# Patient Record
Sex: Male | Born: 1980
Health system: Southern US, Community
[De-identification: ages and names within clinical notes are randomized; demographics above are authoritative.]

## PROBLEM LIST (undated history)

## (undated) HISTORY — PX: OTHER SURGICAL HISTORY: SHX169

---

## 2019-07-17 ENCOUNTER — Telehealth: Payer: Self-pay

## 2019-07-17 ENCOUNTER — Encounter: Payer: Self-pay | Admitting: Family Medicine

## 2019-07-17 ENCOUNTER — Other Ambulatory Visit: Payer: Self-pay

## 2019-07-17 ENCOUNTER — Ambulatory Visit (INDEPENDENT_AMBULATORY_CARE_PROVIDER_SITE_OTHER): Payer: 59 | Admitting: Family Medicine

## 2019-07-17 VITALS — BP 150/84 | HR 94 | Temp 97.8°F | Ht 70.0 in | Wt 309.2 lb

## 2019-07-17 DIAGNOSIS — M25461 Effusion, right knee: Secondary | ICD-10-CM

## 2019-07-17 DIAGNOSIS — M25561 Pain in right knee: Secondary | ICD-10-CM | POA: Diagnosis not present

## 2019-07-17 MED ORDER — METHYLPREDNISOLONE ACETATE 40 MG/ML IJ SUSP
80.0000 mg | Freq: Once | INTRAMUSCULAR | Status: AC
  Administered 2019-07-17: 80 mg via INTRA_ARTICULAR

## 2019-07-17 NOTE — Patient Instructions (Signed)
Set-up a primary care new patient appointment with Dr. Damita Dunnings

## 2019-07-17 NOTE — Telephone Encounter (Signed)
The [place] message did not come thru [facility] portal but [facility] faxed a note for pt that needs knee drained due to swelling. Per Morey Hummingbird at front desk note Dr Lorelei Pont will see pt 07/17/19 at 3:40pm for knee issue only and not as PCP. Oked by Dr Lorelei Pont per Morey Hummingbird. copy of [place] fax sent for scanning.

## 2019-07-17 NOTE — Progress Notes (Signed)
Daleon Willinger T. Carle Fenech, MD Primary Care and [place] at [facility] [place], [address] Phone: [phone]  FAX: [phone]  Rodney Ford - 38 y.o. male  MRN [id number]  Date of Birth: 05/17/1981  Visit Date: 07/17/2019  PCP: Patient, No Pcp Per  Referred by: No ref. provider found  Chief Complaint  Patient presents with  . Knee Pain    RIght   Subjective:   Rodney Ford is a 38 y.o. very pleasant male patient with Body mass index is 44.37 kg/m. who presents with the following:  Rodney Ford is a very nice 38 year old gentleman with weight of 309 pounds and BMI of 44 who presents as a new patient to our office.  He previously had been living in [place], but he is from [place].  He presents today with some acute knee effusion and significant pain.  He does have a prior history of arthroscopy of the right knee done in [place].  He has had his knee drained another time in the past which brought him some significant relief.  He is not having any symptomatic giving way, but his motion is significantly impaired secondary to the large effusion.  Had some sciatica after he moved about three weeks ago.  Moved and then got better, no back pain but he has some sciatica.  Playedd somegolf on Sunday.  Tuesday his knee got a little big and swollen up this morning. Last year had ssame thing.   Scope on 2011, [place].   Past Medical History, Surgical History, Social History, Family History, Problem List, Medications, and Allergies have been reviewed and updated if relevant.  There are no active problems to display for this patient.   History reviewed. No pertinent past medical history.  Past Surgical History:  Procedure Laterality Date  . orthoscopic knee surgery right knee      Social History   Socioeconomic History  . Marital status: Divorced    Spouse name: Not on file  . Number of children: Not on file  . Years of  education: Not on file  . Highest education level: Not on file  Occupational History  . Not on file  Social Needs  . Financial resource strain: Not on file  . Food insecurity    Worry: Not on file    Inability: Not on file  . Transportation needs    Medical: Not on file    Non-medical: Not on file  Tobacco Use  . Smoking status: Current Some Day Smoker    Types: Cigars  . Smokeless tobacco: Never Used  Substance and Sexual Activity  . Alcohol use: Not on file  . Drug use: Not on file  . Sexual activity: Not on file  Lifestyle  . Physical activity    Days per week: Not on file    Minutes per session: Not on file  . Stress: Not on file  Relationships  . Social Herbalist on phone: Not on file    Gets together: Not on file    Attends religious service: Not on file    Active member of club or organization: Not on file    Attends meetings of clubs or organizations: Not on file    Relationship status: Not on file  . Intimate partner violence    Fear of current or ex partner: Not on file    Emotionally abused: Not on file    Physically abused: Not on file  Forced sexual activity: Not on file  Other Topics Concern  . Not on file  Social History Narrative  . Not on file    Family History  Problem Relation Age of Onset  . Anemia Mother   . Diabetes Brother   . Lung cancer Paternal Grandfather     Allergies  Allergen Reactions  . Bee Venom     Medication list reviewed and updated in full in [facility].  GEN: No fevers, chills. Nontoxic. Primarily MSK c/o today. MSK: Detailed in the HPI GI: tolerating PO intake without difficulty Neuro: No numbness, parasthesias, or tingling associated. Otherwise the pertinent positives of the ROS are noted above.   Objective:   BP (!) 150/84   Pulse 94   Temp 97.8 F (36.6 C) (Temporal)   Ht 5\' 10"  (1.778 m)   Wt (!) 309 lb 4 oz (140.3 kg)   SpO2 97%   BMI 44.37 kg/m    GEN: WDWN, NAD, Non-toxic,  Alert & Oriented x 3 HEENT: Atraumatic, Normocephalic.  Ears and Nose: No external deformity. EXTR: No clubbing/cyanosis/edema NEURO: Normal gait.  PSYCH: Normally interactive. Conversant. Not depressed or anxious appearing.  Calm demeanor.    Right knee: Large ballotable effusion.  Mild tenderness on the medial and lateral joint lines.  ACL, PCL, MCL, and LCL are intact.  Other special tests are limited secondary to large effusion.  Radiology: No results found.  Assessment and Plan:     ICD-10-CM   1. Acute pain of right knee  M25.561 methylPREDNISolone acetate (DEPO-MEDROL) injection 80 mg  2. Knee effusion, right  M25.461    Large effusion today, for symptomatic relief I think is reasonable to aspirate his knee and inject it with some corticosteroid.  He also asked me for the name of a primary care doctor and I gave him the name of both Dr. and Dr. Para March.  Aspiration/Injection Procedure Note Rodney Ford April 02, 1981 Date of procedure: 07/17/2019  Procedure: Large Joint Aspiration / Injection with synovial fluid aspiration of knee, R Indications: Pain  Procedure Details Patient verbally consented; risks, benefits, and alternatives explained including possible infection. Patient prepped with Chloraprep. Ethyl chloride for anesthesia. 10 cc of 1% Lidocaine used in wheal then injected Subcutaneous fashion with 22 gauge needle on lateral approach. Under sterilne conditions, 18 gauge needle used via lateral approach to aspirate 85 cc of serosanguinous fluid. Then 8 cc of Lidocaine 1% and 2 mL of Depo-Medrol 40 mg injected. Tolerated well, decreased pain, no complications. Medication: 2 mL of Depo-Medrol 40 mg, equaling Depo-Medrol 80 mg total   Follow-up: prn and with new pcp  Meds ordered this encounter  Medications  . methylPREDNISolone acetate (DEPO-MEDROL) injection 80 mg   No orders of the defined types were placed in this encounter.   Signed,  07/19/2019. Ricquel Foulk,  MD   Outpatient Encounter Medications as of 07/17/2019  Medication Sig  . [EXPIRED] methylPREDNISolone acetate (DEPO-MEDROL) injection 80 mg    No facility-administered encounter medications on file as of 07/17/2019.

## 2019-08-12 ENCOUNTER — Encounter: Payer: Self-pay | Admitting: Family Medicine

## 2019-08-12 ENCOUNTER — Ambulatory Visit (INDEPENDENT_AMBULATORY_CARE_PROVIDER_SITE_OTHER): Payer: 59 | Admitting: Family Medicine

## 2019-08-12 ENCOUNTER — Other Ambulatory Visit: Payer: Self-pay

## 2019-08-12 VITALS — BP 159/95 | HR 84 | Temp 97.6°F | Ht 69.75 in | Wt 303.5 lb

## 2019-08-12 DIAGNOSIS — Z114 Encounter for screening for human immunodeficiency virus [HIV]: Secondary | ICD-10-CM

## 2019-08-12 DIAGNOSIS — F17290 Nicotine dependence, other tobacco product, uncomplicated: Secondary | ICD-10-CM | POA: Diagnosis not present

## 2019-08-12 DIAGNOSIS — M1A9XX0 Chronic gout, unspecified, without tophus (tophi): Secondary | ICD-10-CM | POA: Insufficient documentation

## 2019-08-12 DIAGNOSIS — M109 Gout, unspecified: Secondary | ICD-10-CM | POA: Insufficient documentation

## 2019-08-12 DIAGNOSIS — I10 Essential (primary) hypertension: Secondary | ICD-10-CM | POA: Insufficient documentation

## 2019-08-12 LAB — COMPREHENSIVE METABOLIC PANEL
ALT: 42 U/L (ref 0–53)
AST: 26 U/L (ref 0–37)
Albumin: 4.5 g/dL (ref 3.5–5.2)
Alkaline Phosphatase: 45 U/L (ref 39–117)
BUN: 12 mg/dL (ref 6–23)
CO2: 28 mEq/L (ref 19–32)
Calcium: 9.6 mg/dL (ref 8.4–10.5)
Chloride: 102 mEq/L (ref 96–112)
Creatinine, Ser: 1.1 mg/dL (ref 0.40–1.50)
GFR: 89.48 mL/min (ref >60.00)
Glucose, Bld: 118 mg/dL — ABNORMAL HIGH (ref 70–99)
Potassium: 4.4 mEq/L (ref 3.5–5.1)
Sodium: 137 mEq/L (ref 135–145)
Total Bilirubin: 0.3 mg/dL (ref 0.2–1.2)
Total Protein: 7.7 g/dL (ref 6.0–8.3)

## 2019-08-12 LAB — LIPID PANEL
Cholesterol: 279 mg/dL — ABNORMAL HIGH (ref 0–200)
HDL: 43.7 mg/dL (ref >39.00)
Total CHOL/HDL Ratio: 6
Triglycerides: 418 mg/dL — ABNORMAL HIGH (ref 0.0–149.0)

## 2019-08-12 LAB — HEMOGLOBIN A1C: Hgb A1c MFr Bld: 5.8 % (ref 4.6–6.5)

## 2019-08-12 LAB — LDL CHOLESTEROL, DIRECT: Direct LDL: 147 mg/dL

## 2019-08-12 NOTE — Progress Notes (Signed)
Subjective:     Rodney Ford is a 38 y.o. male presenting for Establish Care (previous PCP Dr Berdine Addison in [place] 2018)     HPI   #HTN - has been in the 150s the last few times - had to have his knee drained last month - had sciatic pain in June - no cp, sob, HA, vision changes   #Skin infection - had a boil that was treated with antibiotics and popped on its own  - much better now than before - drained a lot of fluid when it popped on its own - pain has resolved - has a few days left of abx   Review of Systems  Eyes: Negative for visual disturbance.  Respiratory: Negative for shortness of breath.   Cardiovascular: Negative for chest pain.  Neurological: Negative for headaches.     Social History   Tobacco Use  Smoking Status Current Some Day Smoker  . Years: 17.00  . Types: Cigars  Smokeless Tobacco Never Used  Tobacco Comment   3-4 days a week, 2-3 cigars        Objective:    BP Readings from Last 3 Encounters:  08/12/19 (!) 159/95  07/17/19 (!) 150/84   Wt Readings from Last 3 Encounters:  08/12/19 (!) 303 lb 8 oz (137.7 kg)  07/17/19 (!) 309 lb 4 oz (140.3 kg)    BP (!) 159/95   Pulse 84   Temp 97.6 F (36.4 C)   Ht 5' 9.75" (1.772 m)   Wt (!) 303 lb 8 oz (137.7 kg)   SpO2 97%   BMI 43.86 kg/m    Physical Exam Constitutional:      Appearance: Normal appearance. He is not ill-appearing or diaphoretic.  HENT:     Right Ear: External ear normal.     Left Ear: External ear normal.     Nose: Nose normal.  Eyes:     General: No scleral icterus.    Extraocular Movements: Extraocular movements intact.     Conjunctiva/sclera: Conjunctivae normal.  Cardiovascular:     Rate and Rhythm: Normal rate and regular rhythm.     Heart sounds: No murmur.  Pulmonary:     Effort: Pulmonary effort is normal. No respiratory distress.     Breath sounds: Normal breath sounds. No wheezing.  Musculoskeletal:     Cervical back: Neck supple.  Skin:  General: Skin is warm and dry.     Comments: Ulcer on right upper back without underlying fluid pocket. Granulation tissue present. Non-tender to palpation  Neurological:     Mental Status: He is alert. Mental status is at baseline.  Psychiatric:        Mood and Affect: Mood normal.           Assessment & Plan:   Problem List Items Addressed This Visit      Cardiovascular and Mediastinum   Essential hypertension - Primary    BP elevated today. Pt would like to avoid medication. Long discussion about lifestyle changes. Pt will try to exercise, lose weight, and eat healthy.       Relevant Orders   Comprehensive metabolic panel     Other   Morbid obesity ([facility])    Not interested in surgery. Encouraged weight loss - already down 10 lbs.       Relevant Orders   Lipid Profile   Hemoglobin A1c   Cigar smoker unmotivated to quit    Encourage cessation, pt has cut back but  is not interested in quitting. Will continue to address.        Other Visit Diagnoses    Screening for HIV (human immunodeficiency virus)       Relevant Orders   HIV antibody (with reflex)       Return in about 3 months (around 11/10/2019).  Lynnda Child, MD

## 2019-08-12 NOTE — Assessment & Plan Note (Signed)
Not interested in surgery. Encouraged weight loss - already down 10 lbs.

## 2019-08-12 NOTE — Assessment & Plan Note (Signed)
BP elevated today. Pt would like to avoid medication. Long discussion about lifestyle changes. Pt will try to exercise, lose weight, and eat healthy.

## 2019-08-12 NOTE — Patient Instructions (Signed)
Your blood pressure high.   High blood pressure increases your risk for heart attack and stroke.    Please check your blood pressure 2-4 times a week.   To check your blood pressure 1) Sit in a quiet and relaxed place for 5 minutes 2) Make sure your feet are flat on the ground 3) Consider checking first thing in the morning   Normal blood pressure is less than 140/90 Ideally you blood pressure should be around 120/80  Other ways you can reduce your blood pressure:  1) Regular exercise -- Try to get 150 minutes (30 minutes, 5 days a week) of moderate to vigorous aerobic excercise -- Examples: brisk walking (2.5 miles per hour), water aerobics, dancing, gardening, tennis, biking slower than 10 miles per hour 2) DASH Diet - low fat meats, more fresh fruits and vegetables, whole grains, low salt 3) Quit smoking if you smoke 4) Loose 5-10% of your body weight    DASH Eating Plan DASH stands for "Dietary Approaches to Stop Hypertension." The DASH eating plan is a healthy eating plan that has been shown to reduce high blood pressure (hypertension). It may also reduce your risk for type 2 diabetes, heart disease, and stroke. The DASH eating plan may also help with weight loss. What are tips for following this plan?  General guidelines  Avoid eating more than 2,300 mg (milligrams) of salt (sodium) a day. If you have hypertension, you may need to reduce your sodium intake to 1,500 mg a day.  Limit alcohol intake to no more than 1 drink a day for nonpregnant women and 2 drinks a day for men. One drink equals 12 oz of beer, 5 oz of wine, or 1 oz of hard liquor.  Work with your health care provider to maintain a healthy body weight or to lose weight. Ask what an ideal weight is for you.  Get at least 30 minutes of exercise that causes your heart to beat faster (aerobic exercise) most days of the week. Activities may include walking, swimming, or biking.  Work with your health care provider  or diet and nutrition specialist (dietitian) to adjust your eating plan to your individual calorie needs. Reading food labels   Check food labels for the amount of sodium per serving. Choose foods with less than 5 percent of the Daily Value of sodium. Generally, foods with less than 300 mg of sodium per serving fit into this eating plan.  To find whole grains, look for the word "whole" as the first word in the ingredient list. Shopping  Buy products labeled as "low-sodium" or "no salt added."  Buy fresh foods. Avoid canned foods and premade or frozen meals. Cooking  Avoid adding salt when cooking. Use salt-free seasonings or herbs instead of table salt or sea salt. Check with your health care provider or pharmacist before using salt substitutes.  Do not fry foods. Cook foods using healthy methods such as baking, boiling, grilling, and broiling instead.  Cook with heart-healthy oils, such as olive, canola, soybean, or sunflower oil. Meal planning  Eat a balanced diet that includes: ? 5 or more servings of fruits and vegetables each day. At each meal, try to fill half of your plate with fruits and vegetables. ? Up to 6-8 servings of whole grains each day. ? Less than 6 oz of lean meat, poultry, or fish each day. A 3-oz serving of meat is about the same size as a deck of cards. One egg equals 1 oz. ?   2 servings of low-fat dairy each day. ? A serving of nuts, seeds, or beans 5 times each week. ? Heart-healthy fats. Healthy fats called Omega-3 fatty acids are found in foods such as flaxseeds and coldwater fish, like sardines, salmon, and mackerel.  Limit how much you eat of the following: ? Canned or prepackaged foods. ? Food that is high in trans fat, such as fried foods. ? Food that is high in saturated fat, such as fatty meat. ? Sweets, desserts, sugary drinks, and other foods with added sugar. ? Full-fat dairy products.  Do not salt foods before eating.  Try to eat at least 2  vegetarian meals each week.  Eat more home-cooked food and less restaurant, buffet, and fast food.  When eating at a restaurant, ask that your food be prepared with less salt or no salt, if possible. What foods are recommended? The items listed may not be a complete list. Talk with your dietitian about what dietary choices are best for you. Grains Whole-grain or whole-wheat bread. Whole-grain or whole-wheat pasta. Brown rice. Oatmeal. Quinoa. Bulgur. Whole-grain and low-sodium cereals. Pita bread. Low-fat, low-sodium crackers. Whole-wheat flour tortillas. Vegetables Fresh or frozen vegetables (raw, steamed, roasted, or grilled). Low-sodium or reduced-sodium tomato and vegetable juice. Low-sodium or reduced-sodium tomato sauce and tomato paste. Low-sodium or reduced-sodium canned vegetables. Fruits All fresh, dried, or frozen fruit. Canned fruit in natural juice (without added sugar). Meat and other protein foods Skinless chicken or turkey. Ground chicken or turkey. Pork with fat trimmed off. Fish and seafood. Egg whites. Dried beans, peas, or lentils. Unsalted nuts, nut butters, and seeds. Unsalted canned beans. Lean cuts of beef with fat trimmed off. Low-sodium, lean deli meat. Dairy Low-fat (1%) or fat-free (skim) milk. Fat-free, low-fat, or reduced-fat cheeses. Nonfat, low-sodium ricotta or cottage cheese. Low-fat or nonfat yogurt. Low-fat, low-sodium cheese. Fats and oils Soft margarine without trans fats. Vegetable oil. Low-fat, reduced-fat, or light mayonnaise and salad dressings (reduced-sodium). Canola, safflower, olive, soybean, and sunflower oils. Avocado. Seasoning and other foods Herbs. Spices. Seasoning mixes without salt. Unsalted popcorn and pretzels. Fat-free sweets. What foods are not recommended? The items listed may not be a complete list. Talk with your dietitian about what dietary choices are best for you. Grains Baked goods made with fat, such as croissants, muffins, or  some breads. Dry pasta or rice meal packs. Vegetables Creamed or fried vegetables. Vegetables in a cheese sauce. Regular canned vegetables (not low-sodium or reduced-sodium). Regular canned tomato sauce and paste (not low-sodium or reduced-sodium). Regular tomato and vegetable juice (not low-sodium or reduced-sodium). Pickles. Olives. Fruits Canned fruit in a light or heavy syrup. Fried fruit. Fruit in cream or butter sauce. Meat and other protein foods Fatty cuts of meat. Ribs. Fried meat. Bacon. Sausage. Bologna and other processed lunch meats. Salami. Fatback. Hotdogs. Bratwurst. Salted nuts and seeds. Canned beans with added salt. Canned or smoked fish. Whole eggs or egg yolks. Chicken or turkey with skin. Dairy Whole or 2% milk, cream, and half-and-half. Whole or full-fat cream cheese. Whole-fat or sweetened yogurt. Full-fat cheese. Nondairy creamers. Whipped toppings. Processed cheese and cheese spreads. Fats and oils Butter. Stick margarine. Lard. Shortening. Ghee. Bacon fat. Tropical oils, such as coconut, palm kernel, or palm oil. Seasoning and other foods Salted popcorn and pretzels. Onion salt, garlic salt, seasoned salt, table salt, and sea salt. Worcestershire sauce. Tartar sauce. Barbecue sauce. Teriyaki sauce. Soy sauce, including reduced-sodium. Steak sauce. Canned and packaged gravies. Fish sauce. Oyster sauce. Cocktail sauce.   Horseradish that you find on the shelf. Ketchup. Mustard. Meat flavorings and tenderizers. Bouillon cubes. Hot sauce and Tabasco sauce. Premade or packaged marinades. Premade or packaged taco seasonings. Relishes. Regular salad dressings. Where to find more information:  National Heart, Lung, and Blood Institute: www.nhlbi.nih.gov  American Heart Association: www.heart.org Summary  The DASH eating plan is a healthy eating plan that has been shown to reduce high blood pressure (hypertension). It may also reduce your risk for type 2 diabetes, heart disease,  and stroke.  With the DASH eating plan, you should limit salt (sodium) intake to 2,300 mg a day. If you have hypertension, you may need to reduce your sodium intake to 1,500 mg a day.  When on the DASH eating plan, aim to eat more fresh fruits and vegetables, whole grains, lean proteins, low-fat dairy, and heart-healthy fats.  Work with your health care provider or diet and nutrition specialist (dietitian) to adjust your eating plan to your individual calorie needs. This information is not intended to replace advice given to you by your health care provider. Make sure you discuss any questions you have with your health care provider. Document Released: 08/04/2011 Document Revised: 07/28/2017 Document Reviewed: 08/08/2016 Elsevier Patient Education  2020 Elsevier Inc.  

## 2019-08-12 NOTE — Assessment & Plan Note (Signed)
Encourage cessation, pt has cut back but is not interested in quitting. Will continue to address.

## 2019-08-13 ENCOUNTER — Encounter: Payer: Self-pay | Admitting: Family Medicine

## 2019-08-13 LAB — HIV ANTIBODY (ROUTINE TESTING W REFLEX): HIV 1&2 Ab, 4th Generation: NONREACTIVE

## 2019-10-07 ENCOUNTER — Encounter: Payer: Self-pay | Admitting: Family Medicine

## 2019-11-28 ENCOUNTER — Ambulatory Visit: Payer: 59 | Attending: Internal Medicine

## 2019-11-28 DIAGNOSIS — Z23 Encounter for immunization: Secondary | ICD-10-CM

## 2019-11-28 NOTE — Progress Notes (Signed)
   Covid-19 Vaccination Clinic  Name:  Rodney Ford    MRN: [id number] DOB: Dec 06, 1980  11/28/2019  [name] was observed post Covid-19 immunization for 15 minutes without incident. He was provided with Vaccine Information Sheet and instruction to access the V-Safe system.   [name] was instructed to call 911 with any severe reactions post vaccine: Marland Kitchen Difficulty breathing  . Swelling of face and throat  . A fast heartbeat  . A bad rash all over body  . Dizziness and weakness   Immunizations Administered    Name Date Dose VIS Date Route   Pfizer COVID-19 Vaccine 11/28/2019 11:02 AM 0.3 mL 08/09/2019 Intramuscular   Manufacturer: [employer], [employer]   Lot: [id number]   NDC: [address]-8

## 2019-12-17 ENCOUNTER — Other Ambulatory Visit: Payer: Self-pay

## 2019-12-17 ENCOUNTER — Ambulatory Visit (INDEPENDENT_AMBULATORY_CARE_PROVIDER_SITE_OTHER)
Admission: RE | Admit: 2019-12-17 | Discharge: 2019-12-17 | Disposition: A | Discharge status: Home / Self Care | Payer: 59 | Source: Ambulatory Visit

## 2019-12-17 DIAGNOSIS — G5701 Lesion of sciatic nerve, right lower limb: Secondary | ICD-10-CM | POA: Diagnosis not present

## 2019-12-17 MED ORDER — KETOROLAC TROMETHAMINE 10 MG PO TABS: 10.0000 mg | ORAL_TABLET | Freq: Four times a day (QID) | ORAL | 0 refills | Status: DC | PRN

## 2019-12-17 NOTE — Discharge Instructions (Signed)
Take the ketorolac as directed.    Follow up with your primary care provider or come here to be seen in person if your symptoms are not improving.

## 2019-12-17 NOTE — ED Provider Notes (Signed)
Virtual Visit via Video Note:  Rodney Ford  initiated request for Telemedicine visit with [facility] Urgent [facility] team. I connected with Rodney Ford  on 12/17/2019 at 10:50 AM  for a synchronized telemedicine visit using a video enabled HIPPA compliant telemedicine application. I verified that I am speaking with Rodney Ford  using two identifiers. Rodney Bail, NP  was physically located in a [facility] Urgent care site and Rodney Ford was located at a different location.   The limitations of evaluation and management by telemedicine as well as the availability of in-person appointments were discussed. Patient was informed that he  may incur a bill ( including co-pay) for this virtual visit encounter. Rodney Ford  expressed understanding and gave verbal consent to proceed with virtual visit.     History of Present Illness:Rodney Ford  is a 39 y.o. male presents for evaluation of 3 day history of right buttock pain from his right upper buttock down to his right posterior upper thigh.  He states his pain began after overworking his muscles this weekend.  The pain is worse with bending, sitting, lying down; improves with standing.  He has tried stretching the muscles and massage without relief.  This feels like previous episodes of piriformis syndrome.  He denies numbness or weakness.  No fever, rash, lesions, or other symptoms.     Allergies  Allergen Reactions  . Bee Venom      History reviewed. No pertinent past medical history.   Social History   Tobacco Use  . Smoking status: Current Some Day Smoker    Years: 17.00    Types: Cigars  . Smokeless tobacco: Never Used  . Tobacco comment: 3-4 days a week, 2-3 cigars  Substance Use Topics  . Alcohol use: Yes    Comment: 2 times a week  . Drug use: Never   ROS: as stated in HPI.  All other systems reviewed and negative.      Observations/Objective: Physical Exam  VITALS: Patient denies fever. GENERAL: Alert, appears well  and in no acute distress. HEENT: Atraumatic. NECK: Normal movements of the head and neck. CARDIOPULMONARY: No increased WOB. Speaking in clear sentences. I:E ratio WNL.  MS: Moves all visible extremities without noticeable abnormality. PSYCH: Pleasant and cooperative, well-groomed. Speech normal rate and rhythm. Affect is appropriate. Insight and judgement are appropriate. Attention is focused, linear, and appropriate.  NEURO: CN grossly intact. Oriented as arrived to appointment on time with no prompting. Moves both UE equally.  SKIN: No obvious lesions, wounds, erythema, or cyanosis noted on face or hands.   Assessment and Plan:    ICD-10-CM   1. Piriformis syndrome of right side  G57.01        Follow Up Instructions: Treating with ketorolac as this has treated his symptoms successfully in the past.  Patient states he already has Flexeril that he will use as needed.  He will also continue stretching exercises.  Instructed him to follow up with his PCP if he symptoms do not improve.  Patient agrees with plan of care.      I discussed the assessment and treatment plan with the patient. The patient was provided an opportunity to ask questions and all were answered. The patient agreed with the plan and demonstrated an understanding of the instructions.   The patient was advised to call back or seek an in-person evaluation if the symptoms worsen or if the condition fails to improve as anticipated.  Sharion Balloon, NP  12/17/2019 10:50 AM         Sharion Balloon, NP 12/17/19 1050

## 2019-12-18 ENCOUNTER — Encounter: Payer: Self-pay | Admitting: Family Medicine

## 2019-12-18 ENCOUNTER — Ambulatory Visit (INDEPENDENT_AMBULATORY_CARE_PROVIDER_SITE_OTHER): Payer: 59 | Admitting: Family Medicine

## 2019-12-18 DIAGNOSIS — G5701 Lesion of sciatic nerve, right lower limb: Secondary | ICD-10-CM

## 2019-12-18 MED ORDER — DEXAMETHASONE SODIUM PHOSPHATE 10 MG/ML IJ SOLN
10.0000 mg | Freq: Once | INTRAMUSCULAR | Status: AC
  Administered 2019-12-18: 13:00:00 10 mg via INTRAMUSCULAR

## 2019-12-18 MED ORDER — ACETAMINOPHEN-CODEINE #3 300-30 MG PO TABS: 1.0000 | ORAL_TABLET | Freq: Three times a day (TID) | ORAL | 0 refills | Status: DC | PRN

## 2019-12-18 NOTE — Progress Notes (Signed)
This visit was conducted in person.  BP (!) 154/104 (BP Location: Right Arm, Patient Position: Sitting, Cuff Size: Large)   Pulse 85   Temp 97.9 F (36.6 C) (Temporal)   Ht 5' 9.75" (1.772 m)   Wt 299 lb 8 oz (135.9 kg)   SpO2 96%   BMI 43.28 kg/m    CC: "I think my piriformis flared back up" Subjective:    Patient ID: Rodney Ford, male    DOB: 02-Oct-1980, 39 y.o.   MRN: [id number]  HPI: Rodney Ford is a 39 y.o. male presenting on 12/18/2019 for Hip Pain (C/o constant right hip pain, worsening.  Started 12/15/19.  H/o piriformis syndrome.  Tried Toradol and cyclobenzaprine 10 mg, not helpful. )   3d h/o progressively worsening R hip pain - started after he was lifting friend's riding lawnmower, felt he pulled groin when he lay it down. No significant acute pain at that time but after 2 hour drive he noticed some tightness to R lateral thigh treated with ibuprofen without much benefit. Yesterday acutely worse - seen for telemedicine visit yesterday by [facility] - dx piriformis syndrome, treated with ketorolac 10mg  Q6 hours PRN #20 and flexeril without much benefit. Affecting ability to rest at night time.   Describes constant pain R lateral thigh that starts at lateral-posterior buttock and travels down thigh to lateral calf. Most tightness mid lateral thigh. Better when laying on left. Worse with sitting. When pain gets bad he feels outer foot numbness. No fevers.   No back pain. No weakness of legs. No bowel/bladder accidents.   BP elevated - attributes to pain. Reviewing chart, has had elevated readings in the past - he has not been checking at home  BP Readings from Last 3 Encounters:  12/18/19 (!) 154/104  08/12/19 (!) 159/95  07/17/19 (!) 150/84        Relevant past medical, surgical, family and social history reviewed and updated as indicated. Interim medical history since our last visit reviewed. Allergies and medications reviewed and updated. Outpatient Medications Prior  to Visit  Medication Sig Dispense Refill  . ketorolac (TORADOL) 10 MG tablet Take 1 tablet (10 mg total) by mouth every 6 (six) hours as needed. 20 tablet 0   No facility-administered medications prior to visit.     Per HPI unless specifically indicated in ROS section below Review of Systems Objective:    BP (!) 154/104 (BP Location: Right Arm, Patient Position: Sitting, Cuff Size: Large)   Pulse 85   Temp 97.9 F (36.6 C) (Temporal)   Ht 5' 9.75" (1.772 m)   Wt 299 lb 8 oz (135.9 kg)   SpO2 96%   BMI 43.28 kg/m   Wt Readings from Last 3 Encounters:  12/18/19 299 lb 8 oz (135.9 kg)  08/12/19 (!) 303 lb 8 oz (137.7 kg)  07/17/19 (!) 309 lb 4 oz (140.3 kg)    Physical Exam Vitals and nursing note reviewed.  Constitutional:      General: He is in acute distress.     Appearance: Normal appearance. He is obese. He is not ill-appearing.     Comments: Marked discomfort, unable to sit or lay supine due to pain  Musculoskeletal:        General: Tenderness present.     Right lower leg: No edema.     Left lower leg: No edema.     Comments:  Limited exam due to pain No midline spine tenderness No significnat paraspinous mm tenderness No  pain at SIJ, GTB bilaterally.  +++ pain at sciatic notch on right Stiff gait  Skin:    General: Skin is warm and dry.     Findings: No rash.  Neurological:     Mental Status: He is alert.     Comments: Able to plantar and dorsi flex both feet against resistance (5/5 strength)       Lab Results  Component Value Date   CREATININE 1.10 08/12/2019   BUN 12 08/12/2019   NA 137 08/12/2019   K 4.4 08/12/2019   CL 102 08/12/2019   CO2 28 08/12/2019    Lab Results  Component Value Date   HGBA1C 5.8 08/12/2019    Assessment & Plan:  This visit occurred during the SARS-CoV-2 public health emergency.  Safety protocols were in place, including screening questions prior to the visit, additional usage of staff PPE, and extensive cleaning of exam  room while observing appropriate contact time as indicated for disinfecting solutions.   Problem List Items Addressed This Visit    Piriformis syndrome of right side    Story/exam most consistent with recurrent R piriformis syndrome.  Treat with decadron 10mg  IM today and continue flexeril, toradol, tylenol, then tylenol #3 for breakthrough pain. rec heating pad to sciatic notch (not directly on skin), gentle stretching (exercises provided today from [facility] pt advisor).  Reviewed red flags to seek urgent in person evaluation (acute weakness, worsening despite treatment)           Meds ordered this encounter  Medications  . acetaminophen-codeine (TYLENOL #3) 300-30 MG tablet    Sig: Take 1 tablet by mouth 3 (three) times daily as needed for up to 5 days for moderate pain.    Dispense:  15 tablet    Refill:  0  . dexamethasone (DECADRON) injection 10 mg   No orders of the defined types were placed in this encounter.   Patient Instructions  Goal BP <140/90.  I do think you have Right piriformis syndrome again. Exercises provided today. Treat with steroid shot today. May continue toradol, flexeril, tylenol as needed. Tylenol #3 with codeine for breakthrough pain.  Let [facility] know if any weakness of the right leg develops, or if not improving with above.     Follow up plan: Return if symptoms worsen or fail to improve.  [place], MD

## 2019-12-18 NOTE — Telephone Encounter (Signed)
Pt scheduled for today with Dr Reece Agar at 12:30  Nothing further needed.

## 2019-12-18 NOTE — Patient Instructions (Addendum)
Goal BP <140/90.  I do think you have Right piriformis syndrome again. Exercises provided today. Treat with steroid shot today. May continue toradol, flexeril, tylenol as needed. Tylenol #3 with codeine for breakthrough pain.  Let us know if any weakness of the right leg develops, or if not improving with above.

## 2019-12-18 NOTE — Assessment & Plan Note (Signed)
Story/exam most consistent with recurrent R piriformis syndrome.  Treat with decadron 10mg  IM today and continue flexeril, toradol, tylenol, then tylenol #3 for breakthrough pain. rec heating pad to sciatic notch (not directly on skin), gentle stretching (exercises provided today from [facility] pt advisor).  Reviewed red flags to seek urgent in person evaluation (acute weakness, worsening despite treatment)

## 2019-12-22 ENCOUNTER — Encounter: Payer: Self-pay | Admitting: Family Medicine

## 2019-12-23 ENCOUNTER — Ambulatory Visit: Payer: 59

## 2019-12-23 MED ORDER — GABAPENTIN 300 MG PO CAPS
300.0000 mg | ORAL_CAPSULE | Freq: Two times a day (BID) | ORAL | 0 refills | Status: DC
Start: 2019-12-23 — End: 2020-02-03

## 2019-12-23 MED ORDER — HYDROCODONE-ACETAMINOPHEN 5-325 MG PO TABS: 1.0000 | ORAL_TABLET | Freq: Three times a day (TID) | ORAL | 0 refills | Status: DC | PRN

## 2019-12-23 NOTE — Telephone Encounter (Addendum)
Pt scheduled on 12/25/19 at 8:20 with Dr. Patsy Lager.  FYI to Dr. Reece Agar.

## 2019-12-23 NOTE — Telephone Encounter (Signed)
plz schedule appt with Dr Patsy Lager - can we get him in for Wednesday?

## 2019-12-24 NOTE — Progress Notes (Signed)
Rodney Sacra T. Wafaa Deemer, MD, CAQ Sports Medicine  Primary Care and Sports Medicine [facility] at [facility] [address] [place], [address]  Phone: [phone]  FAX: [phone]  Rodney Ford - 39 y.o. male  MRN [id number]  Date of Birth: 05-13-1981  Date: 12/25/2019  PCP: Lynnda Child, MD  Referral: Lynnda Child, MD  Chief Complaint  Patient presents with  . Hip Pain    Piriformis syndrome of right side    This visit occurred during the SARS-CoV-2 public health emergency.  Safety protocols were in place, including screening questions prior to the visit, additional usage of staff PPE, and extensive cleaning of exam room while observing appropriate contact time as indicated for disinfecting solutions.   Subjective:   Rodney Ford is a 39 y.o. very pleasant male patient with Body mass index is 41.98 kg/m. who presents with the following:  10 d ago.  The patient is here in follow-up regarding some posterior buttocks pain that radiates to his thigh and leg on the right side.  He was diagnosed by my partner 6 days ago with some piriformis syndrome.  Went on a golf trip last weekend.  Carry the deck across the yard.  Anterior groin pain.  Drove back and on the medial leg.  Some pain in the hamstring regoin.    HS pain and in the buttocks.    Has been having some numbness and tingling since Wed. No lifting at work.   On further questioning, the patient does have lightninglike strikes of pain that will sometimes bring him to the ground.  He is having difficulty getting up and walking for any sort of length of time.  He also is having some numbness in his right lateral foot as well as some weakness with plantar flexion at the right ankle as well.  ? Plantarflexion, R ankle R lateral foot and 1st web space dec sensation   Lumbar rad  Review of Systems is noted in the HPI, as appropriate   Objective:   BP (!) 160/110   Pulse (!) 105   Temp  98.2 F (36.8 C) (Temporal)   Ht 5' 9.75" (1.772 m)   Wt 290 lb 8 oz (131.8 kg)   SpO2 97%   BMI 41.98 kg/m    GEN: No acute distress; alert,appropriate. PULM: Breathing comfortably in no respiratory distress PSYCH: Normally interactive.   Range of motion at  the waist: Flexion, extension, lateral bending and rotation: Notably limited.  Forward flexion to 5 degrees.  Extension causes pain.  Lateral bending and rotation is grossly preserved  No echymosis or edema Rises to examination table with mild difficulty Gait: minimally antalgic  Inspection/Deformity: N Paraspinus Tenderness: L1-S1 bilaterally  B Ankle Dorsiflexion (L5,4): 5/5 B Great Toe Dorsiflexion (L5,4): 5/5 Heel Walk (L5): WNL Toe Walk (S1): Decreased on the right Rise/Squat (L4): WNL, mild pain  SENSORY B Medial Foot (L4): WNL B Lateral (S1): Decreased on the right, as well as the first webspace This is to both pinprick and light touch  REFLEXES Knee (L4): 2+ Ankle (S1): 2+  B SLR, seated: neg B SLR, supine: Positive in the right B FABER: neg B Reverse FABER: Positive B Greater Troch: NT B Log Roll: neg B Sciatic Notch: NT   He is directly tender at the piriformis on the right.  He is directly tender at the proximal hamstring insertion on the right.  Strength is 4 - and concentric and eccentric strength  training at both 90 and 30 degrees with the hamstring on the right.  There is no palpable defect or bruising.  Radiology: No results found.  Assessment and Plan:     ICD-10-CM   1. Acute right lumbar radiculopathy  M54.16   2. Piriformis syndrome of right side  G57.01   3. Hamstring strain, right, initial encounter  S76.311A    Clinical history does not seem to go along with pure piriformis syndrome.  With a lightening-like strike of pain and at times being driven to his knees and having difficulty walking and standing this is more typical by a long margin of lumbar radiculopathy secondary to disc  herniation.  Piriformis syndrome typically does not behave in this manner.  The patient also has a proximal grade 1 hamstring injury.  At this point I am not overly concerned about this and I think that this should resolve on its own in this healthy 39 year old male.  For now steroids and gentle range of motion.  Follow-up: Return in about 3 weeks (around 01/15/2020).  Meds ordered this encounter  Medications  . predniSONE (DELTASONE) 20 MG tablet    Sig: 2 tabs po for 7 days, then 1 tab po for 7 days    Dispense:  21 tablet    Refill:  0   There are no discontinued medications. No orders of the defined types were placed in this encounter.   Signed,  Maud Deed. Garret Teale, MD   Outpatient Encounter Medications as of 12/25/2019  Medication Sig  . gabapentin (NEURONTIN) 300 MG capsule Take 1 capsule (300 mg total) by mouth 2 (two) times daily.  Marland Kitchen HYDROcodone-acetaminophen (NORCO/VICODIN) 5-325 MG tablet Take 1 tablet by mouth 3 (three) times daily as needed for moderate pain.  Marland Kitchen ketorolac (TORADOL) 10 MG tablet Take 1 tablet (10 mg total) by mouth every 6 (six) hours as needed.  . predniSONE (DELTASONE) 20 MG tablet 2 tabs po for 7 days, then 1 tab po for 7 days   No facility-administered encounter medications on file as of 12/25/2019.

## 2019-12-25 ENCOUNTER — Encounter: Payer: Self-pay | Admitting: Family Medicine

## 2019-12-25 ENCOUNTER — Other Ambulatory Visit: Payer: Self-pay

## 2019-12-25 ENCOUNTER — Ambulatory Visit (INDEPENDENT_AMBULATORY_CARE_PROVIDER_SITE_OTHER): Payer: 59 | Admitting: Family Medicine

## 2019-12-25 VITALS — BP 160/110 | HR 105 | Temp 98.2°F | Ht 69.75 in | Wt 290.5 lb

## 2019-12-25 DIAGNOSIS — G5701 Lesion of sciatic nerve, right lower limb: Secondary | ICD-10-CM | POA: Diagnosis not present

## 2019-12-25 DIAGNOSIS — M5416 Radiculopathy, lumbar region: Secondary | ICD-10-CM

## 2019-12-25 DIAGNOSIS — S76311A Strain of muscle, fascia and tendon of the posterior muscle group at thigh level, right thigh, initial encounter: Secondary | ICD-10-CM | POA: Diagnosis not present

## 2019-12-25 MED ORDER — PREDNISONE 20 MG PO TABS
ORAL_TABLET | ORAL | 0 refills | Status: DC
Start: 2019-12-25 — End: 2020-01-13

## 2020-01-12 NOTE — Progress Notes (Signed)
Rodney Baba T. Skye Rodarte, MD, [place] at [facility] [place], [address]  Phone: [phone]  FAX: [phone]  [name] - 39 y.o. male  MRN [id number]  Date of Birth: 09/13/1980  Date: 01/13/2020  PCP: Lesleigh Noe, MD  Referral: Lesleigh Noe, MD  Chief Complaint  Patient presents with  . Follow-up    Right Pififormis Syndrome    This visit occurred during the SARS-CoV-2 public health emergency.  Safety protocols were in place, including screening questions prior to the visit, additional usage of staff PPE, and extensive cleaning of exam room while observing appropriate contact time as indicated for disinfecting solutions.   Subjective:   Rodney Ford is a 39 y.o. very pleasant male patient with Body mass index is 41.84 kg/m. who presents with the following:  On his last office visit, the patient had an acute lightninglike striking of pain with radicular symptoms on the right.  He also had some numbness and tingling and focal decrease sensation on the right lateral foot as well as the first webspace.  Question of possible decreased plantar fixation strength.  He also had a minor hamstring injury, grade 1, as well as some piriformis tightness.  He is here today in follow-up of these ongoing symptoms.  Still having ome numbness. Can get get up about 50 steps and numbness on the R and has to change how he is sitting.  Still with some radicular pain. Some pain in the HS. pain is improved, but it is nowhere near his baseline.  Numbness is the problem and radicular.   Decadron 10 mg Toradol 60 mg  12/25/2019 Last OV with Owens Loffler, MD  10 d ago.  The patient is here in follow-up regarding some posterior buttocks pain that radiates to his thigh and leg on the right side.  He was diagnosed by my partner 6 days ago with some piriformis syndrome.  Went on a golf trip  last weekend.  Carry the deck across the yard.  Anterior groin pain.  Drove back and on the medial leg.  Some pain in the hamstring regoin.    HS pain and in the buttocks.    Has been having some numbness and tingling since Wed. No lifting at work.   On further questioning, the patient does have lightninglike strikes of pain that will sometimes bring him to the ground.  He is having difficulty getting up and walking for any sort of length of time.  He also is having some numbness in his right lateral foot as well as some weakness with plantar flexion at the right ankle as well.  ? Plantarflexion, R ankle R lateral foot and 1st web space dec sensation   Lumbar rad  Review of Systems is noted in the HPI, as appropriate   Objective:   BP (!) 165/100 (BP Location: Left Arm, Cuff Size: Large)   Pulse 75   Temp 98 F (36.7 C) (Temporal)   Ht 5' 9.75" (1.772 m)   Wt 289 lb 8 oz (131.3 kg)   SpO2 97%   BMI 41.84 kg/m    GEN: No acute distress; alert,appropriate. PULM: Breathing comfortably in no respiratory distress PSYCH: Normally interactive.   Range of motion at  the waist: Flexion, extension, lateral bending and rotation: Flexion, extension, lateral bending, and rotation are all normal.  No echymosis or edema Rises to examination table with mild difficulty  Gait: minimally antalgic  Inspection/Deformity: N Paraspinus Tenderness: Present level. Pain in the right side posterior pelvis.  Deep palpation.  B Ankle Dorsiflexion (L5,4): 5/5 B Great Toe Dorsiflexion (L5,4): 5/5 Heel Walk (L5): WNL Toe Walk (S1): WNL Rise/Squat (L4): WNL, mild pain  SENSORY B Medial Foot (L4): WNL B Dorsum (L5): WNL B Lateral (S1): Decreased sensation lateral lower extremity as well as lateral foot.  Pinprick and light touch.  REFLEXES Knee (L4): 2+ Ankle (S1): 2+  B SLR, seated: neg B SLR, supine: neg B FABER: neg B Reverse FABER: neg B Greater Troch: NT B Log Roll: neg B  Stork: NT B Sciatic Notch: NT   Radiology: No results found.  Assessment and Plan:     ICD-10-CM   1. Acute right lumbar radiculopathy  M54.16 DG Lumbar Spine Complete    dexamethasone (DECADRON) injection 10 mg    ketorolac (TORADOL) injection 60 mg   Ongoing lumbar radiculopathy.  Improved, but still quite symptomatic. Decadron and Toradol in the office today, start prednisone tomorrow. Continue gabapentin.  Basic range of motion.  Follow-up: Return in about 3 weeks (around 02/03/2020).  Meds ordered this encounter  Medications  . predniSONE (DELTASONE) 20 MG tablet    Sig: 2 tabs po for 7 days, then 1 tab po for 7 days    Dispense:  21 tablet    Refill:  0  . dexamethasone (DECADRON) injection 10 mg  . ketorolac (TORADOL) injection 60 mg   Medications Discontinued During This Encounter  Medication Reason  . predniSONE (DELTASONE) 20 MG tablet Completed Course  . HYDROcodone-acetaminophen (NORCO/VICODIN) 5-325 MG tablet Completed Course  . ketorolac (TORADOL) 10 MG tablet Completed Course   Orders Placed This Encounter  Procedures  . DG Lumbar Spine Complete    Signed,  Mishon Blubaugh T. Alok Minshall, MD   Outpatient Encounter Medications as of 01/13/2020  Medication Sig  . gabapentin (NEURONTIN) 300 MG capsule Take 1 capsule (300 mg total) by mouth 2 (two) times daily.  Marland Kitchen ibuprofen (ADVIL) 200 MG tablet Take 800 mg by mouth every 6 (six) hours as needed.  . predniSONE (DELTASONE) 20 MG tablet 2 tabs po for 7 days, then 1 tab po for 7 days  . [DISCONTINUED] HYDROcodone-acetaminophen (NORCO/VICODIN) 5-325 MG tablet Take 1 tablet by mouth 3 (three) times daily as needed for moderate pain.  . [DISCONTINUED] ketorolac (TORADOL) 10 MG tablet Take 1 tablet (10 mg total) by mouth every 6 (six) hours as needed.  . [DISCONTINUED] predniSONE (DELTASONE) 20 MG tablet 2 tabs po for 7 days, then 1 tab po for 7 days  . [EXPIRED] dexamethasone (DECADRON) injection 10 mg   . [EXPIRED]  ketorolac (TORADOL) injection 60 mg    No facility-administered encounter medications on file as of 01/13/2020.

## 2020-01-13 ENCOUNTER — Ambulatory Visit (INDEPENDENT_AMBULATORY_CARE_PROVIDER_SITE_OTHER): Payer: 59 | Admitting: Family Medicine

## 2020-01-13 ENCOUNTER — Encounter: Payer: Self-pay | Admitting: Family Medicine

## 2020-01-13 ENCOUNTER — Other Ambulatory Visit: Payer: Self-pay

## 2020-01-13 ENCOUNTER — Ambulatory Visit (INDEPENDENT_AMBULATORY_CARE_PROVIDER_SITE_OTHER)
Admission: RE | Admit: 2020-01-13 | Discharge: 2020-01-13 | Disposition: A | Discharge status: Home / Self Care | Payer: 59 | Source: Ambulatory Visit | Attending: Family Medicine | Admitting: Family Medicine

## 2020-01-13 VITALS — BP 165/100 | HR 75 | Temp 98.0°F | Ht 69.75 in | Wt 289.5 lb

## 2020-01-13 DIAGNOSIS — M5416 Radiculopathy, lumbar region: Secondary | ICD-10-CM

## 2020-01-13 MED ORDER — DEXAMETHASONE SODIUM PHOSPHATE 100 MG/10ML IJ SOLN
10.0000 mg | Freq: Once | INTRAMUSCULAR | Status: AC
  Administered 2020-01-13: 10 mg via INTRAMUSCULAR

## 2020-01-13 MED ORDER — KETOROLAC TROMETHAMINE 60 MG/2ML IM SOLN
60.0000 mg | Freq: Once | INTRAMUSCULAR | Status: AC
  Administered 2020-01-13: 60 mg via INTRAMUSCULAR

## 2020-01-13 MED ORDER — PREDNISONE 20 MG PO TABS
ORAL_TABLET | ORAL | 0 refills | Status: DC
Start: 2020-01-13 — End: 2020-02-03

## 2020-02-02 NOTE — Progress Notes (Signed)
Coyle Stordahl T. Markevius Trombetta, MD, CAQ Sports Medicine  Primary Care and Sports Medicine [facility] at [facility] [address] [place], [address]  Phone: [phone]  FAX: [phone]  Pacey Altizer - 39 y.o. male  MRN [id number]  Date of Birth: 05/13/1981  Date: 02/03/2020  PCP: Lynnda Child, MD  Referral: Lynnda Child, MD  Chief Complaint  Patient presents with  . Follow-up    Right Lumbar Radiculopathy    This visit occurred during the SARS-CoV-2 public health emergency.  Safety protocols were in place, including screening questions prior to the visit, additional usage of staff PPE, and extensive cleaning of exam room while observing appropriate contact time as indicated for disinfecting solutions.   Subjective:   Barnard Sharps is a 39 y.o. very pleasant male patient with Body mass index is 41.69 kg/m. who presents with the following:  Pleasant gentleman who presents in follow-up for acute lumbar radiculopathy on the right with some decreased sensation and some weakness at the plantar flexion at original presentation.  On his last 2 office visits I did give him a round of prednisone each, and his last office visit additionally gave him an injection of some Toradol as well as Decadron.  He is also been on gabapentin.  his strength has returned to normal and bilateral lower extremities.  He still has persistent right-sided lateral numbness on the right.  He is walking normally now.  He does have some increased fatigue with walking and standing.  01/13/2020 Last OV with Hannah Beat, MD  Vinny Taranto is a 39 y.o. very pleasant male patient with Body mass index is 41.84 kg/m. who presents with the following:   On his last office visit, the patient had an acute lightninglike striking of pain with radicular symptoms on the right.  He also had some numbness and tingling and focal decrease sensation on the right lateral foot as well as the first  webspace.  Question of possible decreased plantar fixation strength.   He also had a minor hamstring injury, grade 1, as well as some piriformis tightness.   He is here today in follow-up of these ongoing symptoms.   Still having ome numbness. Can get get up about 50 steps and numbness on the R and has to change how he is sitting.  Still with some radicular pain. Some pain in the HS. pain is improved, but it is nowhere near his baseline.   Numbness is the problem and radicular.    Decadron 10 mg Toradol 60 mg   12/25/2019 Last OV with Hannah Beat, MD  10 d ago.   The patient is here in follow-up regarding some posterior buttocks pain that radiates to his thigh and leg on the right side.  He was diagnosed by my partner 6 days ago with some piriformis syndrome.   Went on a golf trip last weekend.  Carry the deck across the yard.  Anterior groin pain.  Drove back and on the medial leg.  Some pain in the hamstring regoin.    HS pain and in the buttocks.     Has been having some numbness and tingling since Wed. No lifting at work.    On further questioning, the patient does have lightninglike strikes of pain that will sometimes bring him to the ground.  He is having difficulty getting up and walking for any sort of length of time.  He also is having some numbness in his right lateral foot  as well as some weakness with plantar flexion at the right ankle as well.   ? Plantarflexion, R ankle R lateral foot and 1st web space dec sensation     Lumbar rad   Review of Systems is noted in the HPI, as appropriate   Objective:   BP (!) 150/108   Pulse 74   Temp 97.9 F (36.6 C) (Temporal)   Ht 5' 9.75" (1.772 m)   Wt 288 lb 8 oz (130.9 kg)   SpO2 97%   BMI 41.69 kg/m    GEN: No acute distress; alert,appropriate. PULM: Breathing comfortably in no respiratory distress PSYCH: Normally interactive.   Range of motion at  the waist: Flexion, extension, lateral bending and rotation:  Grossly full range of motion with minimal impairment No echymosis or edema Rises to examination table with mild difficulty Gait: minimally antalgic  Inspection/Deformity: N Paraspinus Tenderness: L4-S1 bilaterally  B Ankle Dorsiflexion (L5,4): 5/5 B Great Toe Dorsiflexion (L5,4): 5/5 Heel Walk (L5): WNL Toe Walk (S1): WNL Rise/Squat (L4): WNL, mild pain  SENSORY B Medial Foot (L4): WNL B Dorsum (L5): WNL B Lateral (S1): Decreased sensation to soft touch as well as pinprick on the lateral lower extremity in the distal thigh.  This is also present on the lateral foot.  REFLEXES Knee (L4): 2+ Ankle (S1): 2+  B SLR, seated: neg B SLR, supine: neg B FABER: neg B Reverse FABER: neg B Greater Troch: NT B Log Roll: neg B Stork: NT B Sciatic Notch: NT   Radiology: DG Lumbar Spine Complete  Result Date: 01/13/2020 CLINICAL DATA:  Low back pain with radicular symptoms EXAM: LUMBAR SPINE - COMPLETE 4+ VIEW COMPARISON:  None. FINDINGS: Frontal, lateral, spot lumbosacral lateral, and bilateral oblique views were obtained. There are 5 non-rib-bearing lumbar type vertebral bodies. There is no fracture or spondylolisthesis. There is moderate disc space narrowing at L5-S1. Other disc spaces appear unremarkable. There is no appreciable facet arthropathy. IMPRESSION: Disc space narrowing at L5-S1. Other disc spaces appear unremarkable. No appreciable fracture or spondylolisthesis. Electronically Signed   By: Bretta Bang III M.D.   On: 01/13/2020 11:22    Assessment and Plan:     ICD-10-CM   1. Acute right lumbar radiculopathy  M54.16 MR Lumbar Spine Wo Contrast  2. Numbness and tingling of right lower extremity  R20.0 MR Lumbar Spine Wo Contrast   R20.2    Right-sided lumbar radiculopathy with decreased sensation on the right lower extremity with some continued decreased sensation in the thigh, lower leg, as well as the lateral foot.  He has had 2 rounds of steroids and some basic  home rehab without improvement.  Obtain an MRI of the lumbar spine to evaluate for cord compression, foraminal narrowing with nerve rootlet compression, cord edema.  MRI will dictate further plans of care.  Follow-up: Return in about 1 month (around 03/04/2020).  No orders of the defined types were placed in this encounter.  Medications Discontinued During This Encounter  Medication Reason  . predniSONE (DELTASONE) 20 MG tablet Completed Course   Orders Placed This Encounter  Procedures  . MR Lumbar Spine Wo Contrast    Signed,  Davianna Deutschman T. Marley Charlot, MD   Outpatient Encounter Medications as of 02/03/2020  Medication Sig  . ibuprofen (ADVIL) 200 MG tablet Take 800 mg by mouth every 6 (six) hours as needed.  . gabapentin (NEURONTIN) 300 MG capsule Take 1 capsule (300 mg total) by mouth 2 (two) times daily. (Patient not taking: Reported  on 02/03/2020)  . [DISCONTINUED] predniSONE (DELTASONE) 20 MG tablet 2 tabs po for 7 days, then 1 tab po for 7 days   No facility-administered encounter medications on file as of 02/03/2020.

## 2020-02-03 ENCOUNTER — Other Ambulatory Visit: Payer: Self-pay

## 2020-02-03 ENCOUNTER — Other Ambulatory Visit: Payer: Self-pay | Admitting: Family Medicine

## 2020-02-03 ENCOUNTER — Encounter: Payer: Self-pay | Admitting: Family Medicine

## 2020-02-03 ENCOUNTER — Ambulatory Visit (INDEPENDENT_AMBULATORY_CARE_PROVIDER_SITE_OTHER): Payer: 59 | Admitting: Family Medicine

## 2020-02-03 VITALS — BP 150/108 | HR 74 | Temp 97.9°F | Ht 69.75 in | Wt 288.5 lb

## 2020-02-03 DIAGNOSIS — M5416 Radiculopathy, lumbar region: Secondary | ICD-10-CM | POA: Diagnosis not present

## 2020-02-03 DIAGNOSIS — R200 Anesthesia of skin: Secondary | ICD-10-CM

## 2020-02-03 DIAGNOSIS — R202 Paresthesia of skin: Secondary | ICD-10-CM

## 2020-02-03 MED ORDER — GABAPENTIN 300 MG PO CAPS: 300.0000 mg | ORAL_CAPSULE | Freq: Two times a day (BID) | ORAL | 0 refills | Status: DC

## 2020-02-03 NOTE — Telephone Encounter (Signed)
Rx was last refilled on 12/23/19 for #60 with 0 refills. Patient was seen today with Dr. Patsy Lager, and has no upcoming appts. Is this ok tor refill?

## 2020-02-06 ENCOUNTER — Ambulatory Visit
Admission: RE | Admit: 2020-02-06 | Discharge: 2020-02-06 | Disposition: A | Discharge status: Home / Self Care | Payer: 59 | Source: Ambulatory Visit | Attending: Family Medicine | Admitting: Family Medicine

## 2020-02-06 ENCOUNTER — Other Ambulatory Visit: Payer: Self-pay

## 2020-02-06 DIAGNOSIS — R202 Paresthesia of skin: Secondary | ICD-10-CM | POA: Diagnosis present

## 2020-02-06 DIAGNOSIS — R200 Anesthesia of skin: Secondary | ICD-10-CM | POA: Diagnosis present

## 2020-02-06 DIAGNOSIS — M5416 Radiculopathy, lumbar region: Secondary | ICD-10-CM

## 2020-02-20 ENCOUNTER — Other Ambulatory Visit: Payer: Self-pay | Admitting: Family Medicine

## 2020-02-20 DIAGNOSIS — M5416 Radiculopathy, lumbar region: Secondary | ICD-10-CM

## 2020-02-20 DIAGNOSIS — M48061 Spinal stenosis, lumbar region without neurogenic claudication: Secondary | ICD-10-CM

## 2020-02-20 DIAGNOSIS — M5126 Other intervertebral disc displacement, lumbar region: Secondary | ICD-10-CM

## 2020-05-30 IMAGING — DX DG LUMBAR SPINE COMPLETE 4+V
5 series · 5 of 5 positions shown · non-contrast
Comparison: None.

CLINICAL DATA: Low back pain with radicular symptoms

EXAM:
LUMBAR SPINE - COMPLETE 4+ VIEW

[l-spine ap]
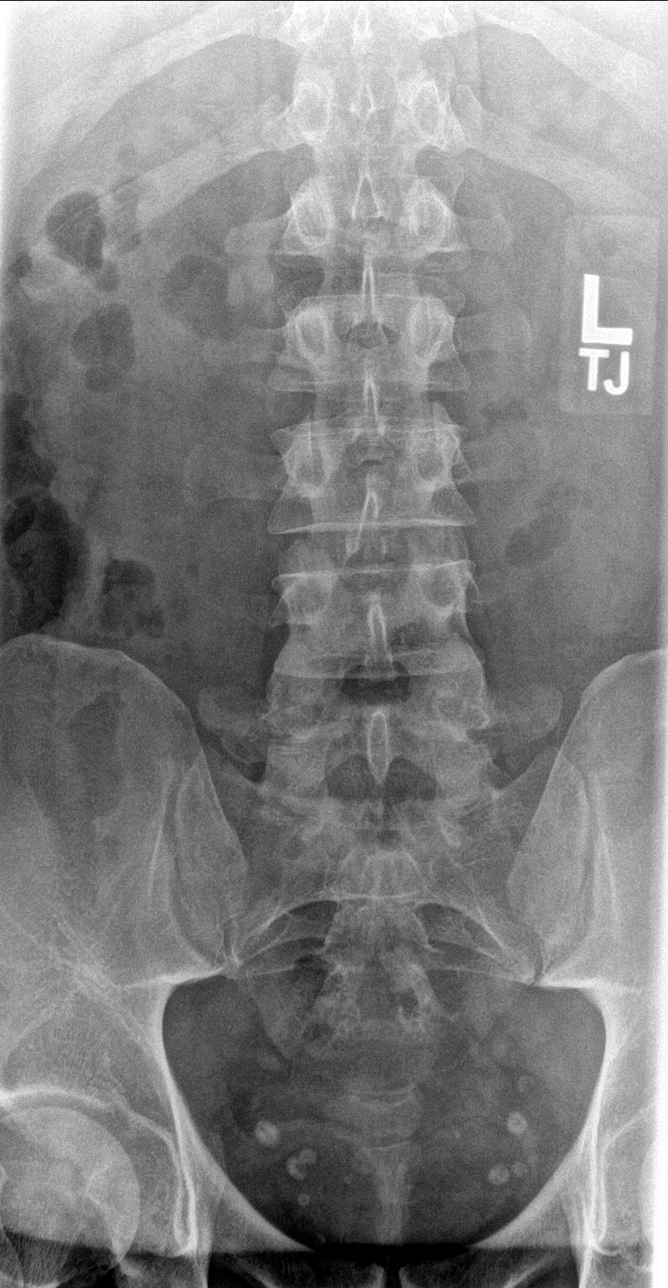

[l-spine obl (1 of 2)]
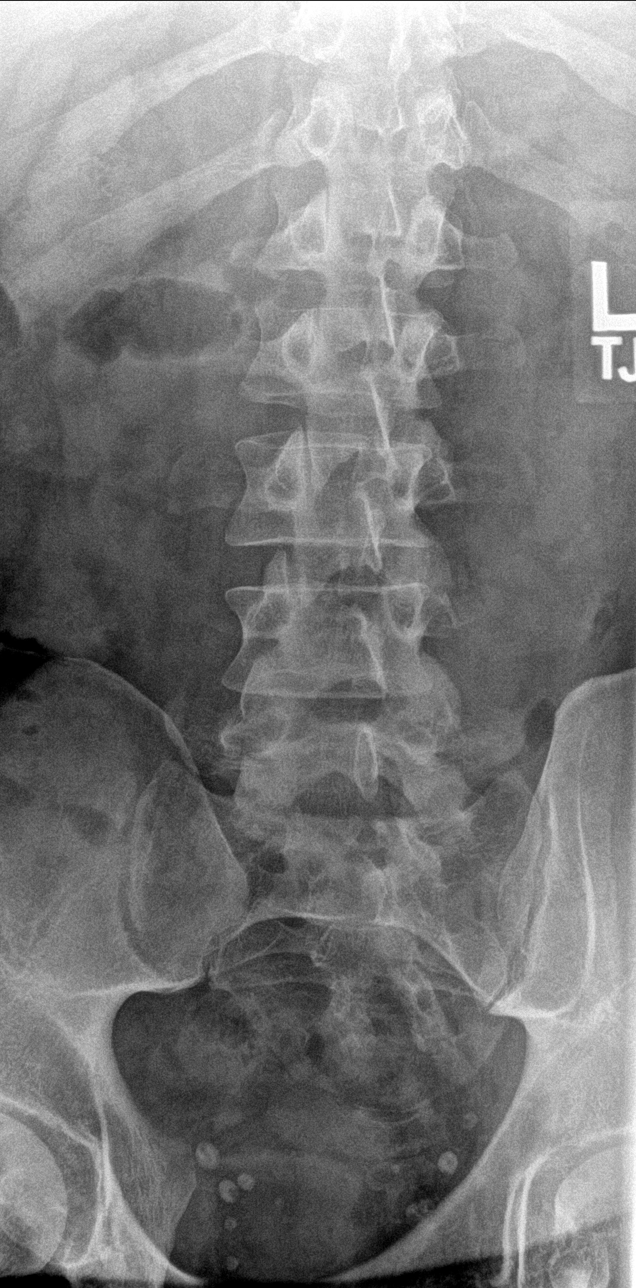

[l-spine obl (2 of 2)]
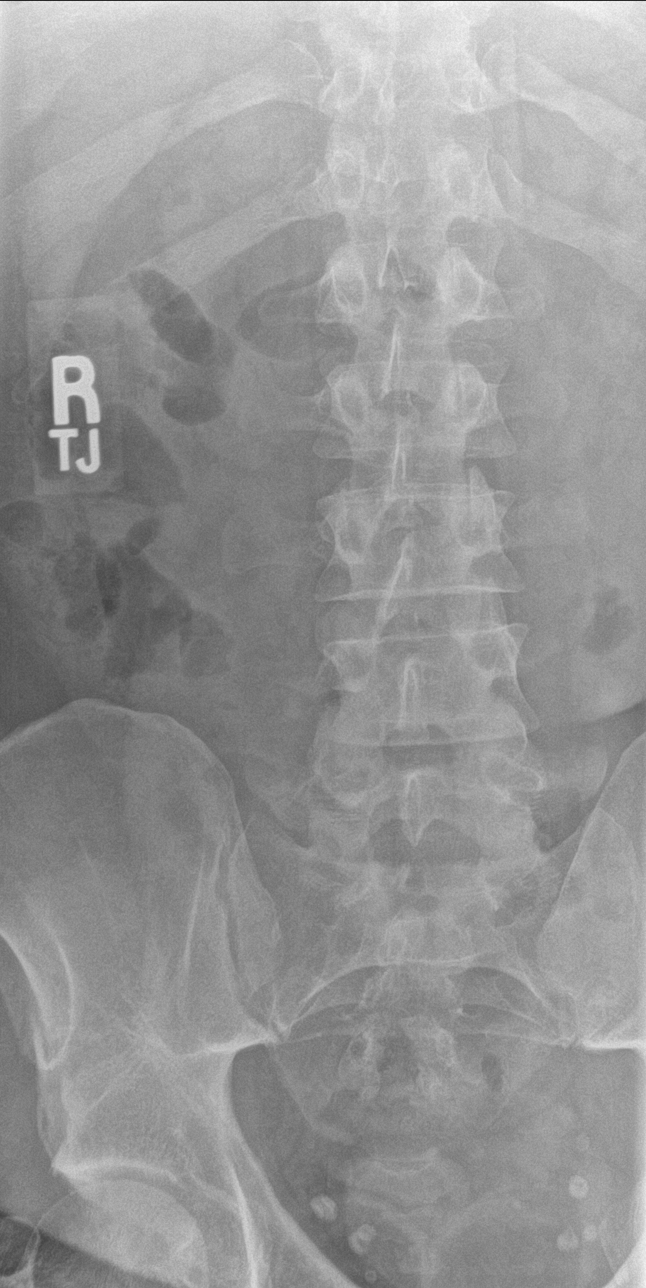

[l-spine lat]
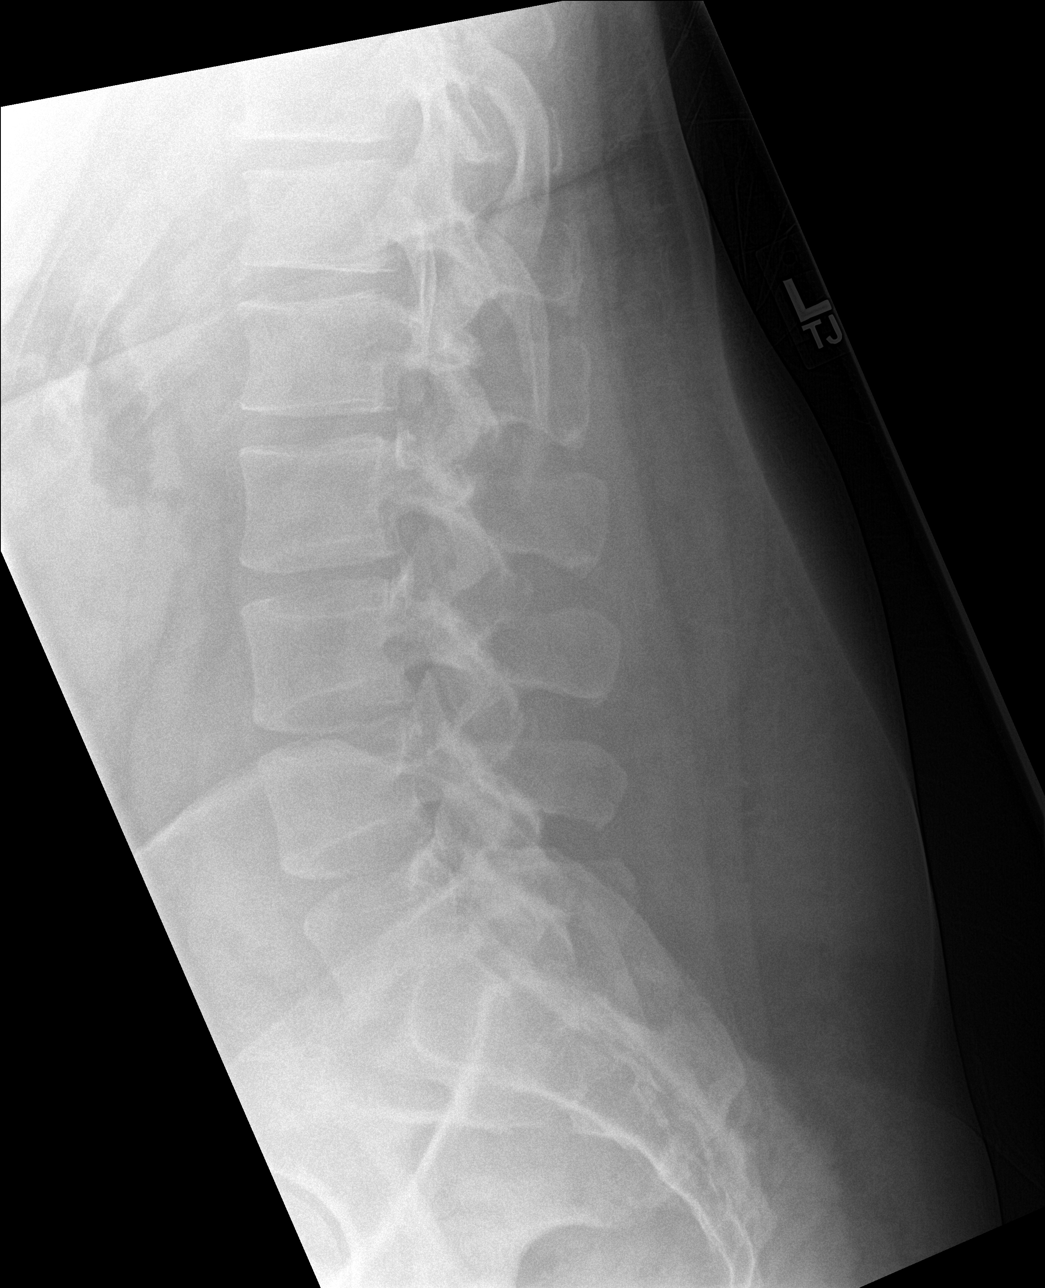

[l-spine l5/s1]
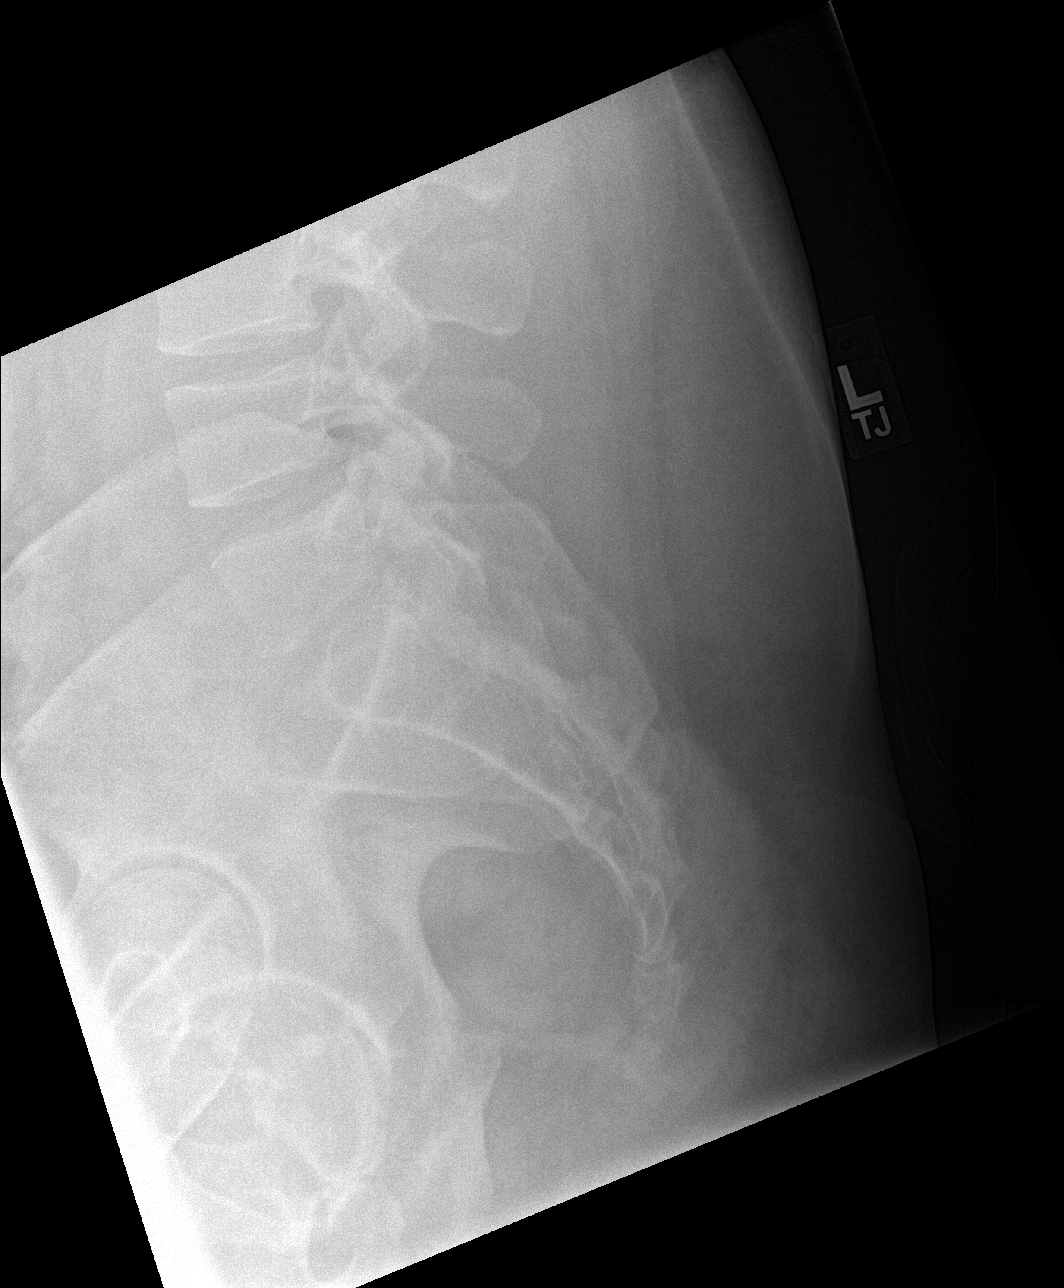

[5 of 5 positions shown; findings below may reference images not displayed]

FINDINGS: Frontal, lateral, spot lumbosacral lateral, and bilateral oblique
views were obtained. There are 5 non-rib-bearing lumbar type
vertebral bodies. There is no fracture or spondylolisthesis. There
is moderate disc space narrowing at L5-S1. Other disc spaces appear
unremarkable. There is no appreciable facet arthropathy.
IMPRESSION: Disc space narrowing at L5-S1. Other disc spaces appear
unremarkable. No appreciable fracture or spondylolisthesis.

## 2020-06-14 ENCOUNTER — Other Ambulatory Visit: Payer: Self-pay | Admitting: Family Medicine

## 2020-06-14 ENCOUNTER — Encounter: Payer: Self-pay | Admitting: Family Medicine

## 2020-06-15 MED ORDER — GABAPENTIN 300 MG PO CAPS: 300.0000 mg | ORAL_CAPSULE | Freq: Two times a day (BID) | ORAL | 5 refills | Status: DC

## 2020-07-31 ENCOUNTER — Encounter: Payer: Self-pay | Admitting: Family Medicine

## 2020-12-07 ENCOUNTER — Other Ambulatory Visit: Payer: Self-pay

## 2020-12-07 ENCOUNTER — Encounter: Payer: Self-pay | Admitting: Family Medicine

## 2020-12-07 ENCOUNTER — Ambulatory Visit (INDEPENDENT_AMBULATORY_CARE_PROVIDER_SITE_OTHER): Payer: 59 | Admitting: Family Medicine

## 2020-12-07 VITALS — BP 154/100 | HR 90 | Temp 97.7°F | Ht 70.0 in | Wt 299.5 lb

## 2020-12-07 DIAGNOSIS — R7303 Prediabetes: Secondary | ICD-10-CM | POA: Diagnosis not present

## 2020-12-07 DIAGNOSIS — I10 Essential (primary) hypertension: Secondary | ICD-10-CM | POA: Diagnosis not present

## 2020-12-07 DIAGNOSIS — Z23 Encounter for immunization: Secondary | ICD-10-CM | POA: Diagnosis not present

## 2020-12-07 DIAGNOSIS — M5126 Other intervertebral disc displacement, lumbar region: Secondary | ICD-10-CM | POA: Diagnosis not present

## 2020-12-07 DIAGNOSIS — Z1159 Encounter for screening for other viral diseases: Secondary | ICD-10-CM

## 2020-12-07 DIAGNOSIS — R41840 Attention and concentration deficit: Secondary | ICD-10-CM | POA: Insufficient documentation

## 2020-12-07 MED ORDER — GABAPENTIN 300 MG PO CAPS: 300.0000 mg | ORAL_CAPSULE | Freq: Two times a day (BID) | ORAL | 5 refills | Status: DC

## 2020-12-07 MED ORDER — HYDROCHLOROTHIAZIDE 25 MG PO TABS: 25.0000 mg | ORAL_TABLET | Freq: Every day | ORAL | 0 refills | Status: DC

## 2020-12-07 NOTE — Progress Notes (Signed)
Subjective:     Rodney Ford is a 40 y.o. male presenting for Annual Exam (Has focusing concerns )     HPI  #HTN - has not been exercising due to injury - diet: steaks, grilled chicken, pork chops - rare veggies - eats rice twice a week, eats wraps with tortilla's no bread  - no cp, sob, headaches - feels well - alcohol: 4-8 oz of whiskey 3-4 days a week   #focusing issues - has been going on for several years - is working and having difficulty staying on task, will have 8 things going on at once  - works from home and difficulty staying on task - will get up to do laundry, his hair - no hx of adhd as a child  - made good grades in school - did well in college, and [employer] - does snore - no daytime sleepiness    Review of Systems   Social History   Tobacco Use  Smoking Status Current Some Day Smoker  . Years: 17.00  . Types: Cigars  Smokeless Tobacco Never Used  Tobacco Comment   3-4 days a week, 2-3 cigars        Objective:    BP Readings from Last 3 Encounters:  12/07/20 (!) 154/100  02/03/20 (!) 150/108  01/13/20 (!) 165/100   Wt Readings from Last 3 Encounters:  12/07/20 299 lb 8 oz (135.9 kg)  02/03/20 288 lb 8 oz (130.9 kg)  01/13/20 289 lb 8 oz (131.3 kg)    BP (!) 154/100   Pulse 90   Temp 97.7 F (36.5 C) (Temporal)   Ht 5\' 10"  (1.778 m)   Wt 299 lb 8 oz (135.9 kg)   SpO2 98%   BMI 42.97 kg/m    Physical Exam Constitutional:      Appearance: Normal appearance. He is obese. He is not ill-appearing or diaphoretic.  HENT:     Right Ear: External ear normal.     Left Ear: External ear normal.     Nose: Nose normal.  Eyes:     General: No scleral icterus.    Extraocular Movements: Extraocular movements intact.     Conjunctiva/sclera: Conjunctivae normal.  Cardiovascular:     Rate and Rhythm: Normal rate and regular rhythm.     Heart sounds: No murmur heard.   Pulmonary:     Effort: Pulmonary effort is normal. No  respiratory distress.     Breath sounds: Normal breath sounds. No wheezing.  Musculoskeletal:     Cervical back: Neck supple.  Skin:    General: Skin is warm and dry.  Neurological:     Mental Status: He is alert. Mental status is at baseline.  Psychiatric:        Mood and Affect: Mood normal.     Results of the Epworth flowsheet 12/07/2020  Sitting and reading 0  Watching TV 0  Sitting, inactive in a public place (e.g. a theatre or a meeting) 0  As a passenger in a car for an hour without a break 0  Lying down to rest in the afternoon when circumstances permit 3  Sitting and talking to someone 0  Sitting quietly after a lunch without alcohol 0  In a car, while stopped for a few minutes in traffic 0  Total score 3        Assessment & Plan:   Problem List Items Addressed This Visit      Cardiovascular and Mediastinum   Essential  hypertension - Primary    Persistent HTN. Advised cutting back cigars/alcohol to once per week. DASH diet provided. Start HCTZ 25 mg. Return for labs in 1-2 weeks for recheck in 2 months.       Relevant Medications   hydrochlorothiazide (HYDRODIURIL) 25 MG tablet   Other Relevant Orders   Comprehensive metabolic panel   TSH   Lipid panel   CBC     Musculoskeletal and Integument   Lumbar herniated disc    Failed several treatments. Stable on gabapentin 300 mg 1-2 times daily.       Relevant Medications   gabapentin (NEURONTIN) 300 MG capsule     Other   Difficulty concentrating    ADHD less likely as newer issue. PHQ-9 is minimally elevated. Hand out for stratagies. Considered OSA given snoring but epworth was normal. If no improvement anticipate r/o OSA prior to psych consult.       Prediabetes    Encouraged healthy diet. Will recheck next week.       Relevant Orders   Hemoglobin A1c    Other Visit Diagnoses    Need for hepatitis C screening test       Relevant Orders   Hepatitis C antibody   Need for Tdap vaccination        Relevant Orders   Tdap vaccine greater than or equal to 7yo IM (Completed)       Return in about 2 months (around 02/06/2021) for for annual.  Lynnda Child, MD  This visit occurred during the SARS-CoV-2 public health emergency.  Safety protocols were in place, including screening questions prior to the visit, additional usage of staff PPE, and extensive cleaning of exam room while observing appropriate contact time as indicated for disinfecting solutions.

## 2020-12-07 NOTE — Assessment & Plan Note (Signed)
Failed several treatments. Stable on gabapentin 300 mg 1-2 times daily.

## 2020-12-07 NOTE — Assessment & Plan Note (Signed)
Persistent HTN. Advised cutting back cigars/alcohol to once per week. DASH diet provided. Start HCTZ 25 mg. Return for labs in 1-2 weeks for recheck in 2 months.

## 2020-12-07 NOTE — Assessment & Plan Note (Signed)
Encouraged healthy diet. Will recheck next week.

## 2020-12-07 NOTE — Patient Instructions (Addendum)
Your blood pressure high.   High blood pressure increases your risk for heart attack and stroke.    Please check your blood pressure 2-4 times a week.   To check your blood pressure 1) Sit in a quiet and relaxed place for 5 minutes 2) Make sure your feet are flat on the ground 3) Consider checking first thing in the morning   Normal blood pressure is less than 140/90 Ideally you blood pressure should be around 120/80  Other ways you can reduce your blood pressure:  1) Regular exercise -- Try to get 150 minutes (30 minutes, 5 days a week) of moderate to vigorous aerobic excercise -- Examples: brisk walking (2.5 miles per hour), water aerobics, dancing, gardening, tennis, biking slower than 10 miles per hour 2) DASH Diet - low fat meats, more fresh fruits and vegetables, whole grains, low salt 3) Quit smoking if you smoke 4) Loose 5-10% of your body weight   https://www.mata.com/.pdf">  DASH Eating Plan DASH stands for Dietary Approaches to Stop Hypertension. The DASH eating plan is a healthy eating plan that has been shown to:  Reduce high blood pressure (hypertension).  Reduce your risk for type 2 diabetes, heart disease, and stroke.  Help with weight loss. What are tips for following this plan? Reading food labels  Check food labels for the amount of salt (sodium) per serving. Choose foods with less than 5 percent of the Daily Value of sodium. Generally, foods with less than 300 milligrams (mg) of sodium per serving fit into this eating plan.  To find whole grains, look for the word "whole" as the first word in the ingredient list. Shopping  Buy products labeled as "low-sodium" or "no salt added."  Buy fresh foods. Avoid canned foods and pre-made or frozen meals. Cooking  Avoid adding salt when cooking. Use salt-free seasonings or herbs instead of table salt or sea salt. Check with your health care provider or pharmacist before  using salt substitutes.  Do not fry foods. Cook foods using healthy methods such as baking, boiling, grilling, roasting, and broiling instead.  Cook with heart-healthy oils, such as olive, canola, avocado, soybean, or sunflower oil. Meal planning  Eat a balanced diet that includes: ? 4 or more servings of fruits and 4 or more servings of vegetables each day. Try to fill one-half of your plate with fruits and vegetables. ? 6-8 servings of whole grains each day. ? Less than 6 oz (170 g) of lean meat, poultry, or fish each day. A 3-oz (85-g) serving of meat is about the same size as a deck of cards. One egg equals 1 oz (28 g). ? 2-3 servings of low-fat dairy each day. One serving is 1 cup (237 mL). ? 1 serving of nuts, seeds, or beans 5 times each week. ? 2-3 servings of heart-healthy fats. Healthy fats called omega-3 fatty acids are found in foods such as walnuts, flaxseeds, fortified milks, and eggs. These fats are also found in cold-water fish, such as sardines, salmon, and mackerel.  Limit how much you eat of: ? Canned or prepackaged foods. ? Food that is high in trans fat, such as some fried foods. ? Food that is high in saturated fat, such as fatty meat. ? Desserts and other sweets, sugary drinks, and other foods with added sugar. ? Full-fat dairy products.  Do not salt foods before eating.  Do not eat more than 4 egg yolks a week.  Try to eat at least 2 vegetarian meals  a week.  Eat more home-cooked food and less restaurant, buffet, and fast food.   Lifestyle  When eating at a restaurant, ask that your food be prepared with less salt or no salt, if possible.  If you drink alcohol: ? Limit how much you use to:  0-1 drink a day for women who are not pregnant.  0-2 drinks a day for men. ? Be aware of how much alcohol is in your drink. In the [place] one drink equals one 12 oz bottle of beer (355 mL), one 5 oz glass of wine (148 mL), or one 1 oz glass of hard liquor (44  mL). General information  Avoid eating more than 2,300 mg of salt a day. If you have hypertension, you may need to reduce your sodium intake to 1,500 mg a day.  Work with your health care provider to maintain a healthy body weight or to lose weight. Ask what an ideal weight is for you.  Get at least 30 minutes of exercise that causes your heart to beat faster (aerobic exercise) most days of the week. Activities may include walking, swimming, or biking.  Work with your health care provider or dietitian to adjust your eating plan to your individual calorie needs. What foods should I eat? Fruits All fresh, dried, or frozen fruit. Canned fruit in natural juice (without added sugar). Vegetables Fresh or frozen vegetables (raw, steamed, roasted, or grilled). Low-sodium or reduced-sodium tomato and vegetable juice. Low-sodium or reduced-sodium tomato sauce and tomato paste. Low-sodium or reduced-sodium canned vegetables. Grains Whole-grain or whole-wheat bread. Whole-grain or whole-wheat pasta. Brown rice. Orpah Cobb. Bulgur. Whole-grain and low-sodium cereals. Pita bread. Low-fat, low-sodium crackers. Whole-wheat flour tortillas. Meats and other proteins Skinless chicken or [place]. Ground chicken or [place]. Pork with fat trimmed off. Fish and seafood. Egg whites. Dried beans, peas, or lentils. Unsalted nuts, nut butters, and seeds. Unsalted canned beans. Lean cuts of beef with fat trimmed off. Low-sodium, lean precooked or cured meat, such as sausages or meat loaves. Dairy Low-fat (1%) or fat-free (skim) milk. Reduced-fat, low-fat, or fat-free cheeses. Nonfat, low-sodium ricotta or cottage cheese. Low-fat or nonfat yogurt. Low-fat, low-sodium cheese. Fats and oils Soft margarine without trans fats. Vegetable oil. Reduced-fat, low-fat, or light mayonnaise and salad dressings (reduced-sodium). Canola, safflower, olive, avocado, soybean, and sunflower oils. Avocado. Seasonings and  condiments Herbs. Spices. Seasoning mixes without salt. Other foods Unsalted popcorn and pretzels. Fat-free sweets. The items listed above may not be a complete list of foods and beverages you can eat. Contact a dietitian for more information. What foods should I avoid? Fruits Canned fruit in a light or heavy syrup. Fried fruit. Fruit in cream or butter sauce. Vegetables Creamed or fried vegetables. Vegetables in a cheese sauce. Regular canned vegetables (not low-sodium or reduced-sodium). Regular canned tomato sauce and paste (not low-sodium or reduced-sodium). Regular tomato and vegetable juice (not low-sodium or reduced-sodium). Rosita Fire. Olives. Grains Baked goods made with fat, such as croissants, muffins, or some breads. Dry pasta or rice meal packs. Meats and other proteins Fatty cuts of meat. Ribs. Fried meat. Tomasa Blase. Bologna, salami, and other precooked or cured meats, such as sausages or meat loaves. Fat from the back of a pig (fatback). Bratwurst. Salted nuts and seeds. Canned beans with added salt. Canned or smoked fish. Whole eggs or egg yolks. Chicken or [place] with skin. Dairy Whole or 2% milk, cream, and half-and-half. Whole or full-fat cream cheese. Whole-fat or sweetened yogurt. Full-fat cheese. Nondairy creamers. Whipped toppings.  Processed cheese and cheese spreads. Fats and oils Butter. Stick margarine. Lard. Shortening. Ghee. Bacon fat. Tropical oils, such as coconut, palm kernel, or palm oil. Seasonings and condiments Onion salt, garlic salt, seasoned salt, table salt, and sea salt. Worcestershire sauce. Tartar sauce. Barbecue sauce. Teriyaki sauce. Soy sauce, including reduced-sodium. Steak sauce. Canned and packaged gravies. Fish sauce. Oyster sauce. Cocktail sauce. Store-bought horseradish. Ketchup. Mustard. Meat flavorings and tenderizers. Bouillon cubes. Hot sauces. Pre-made or packaged marinades. Pre-made or packaged taco seasonings. Relishes. Regular salad  dressings. Other foods Salted popcorn and pretzels. The items listed above may not be a complete list of foods and beverages you should avoid. Contact a dietitian for more information. Where to find more information  National Heart, Lung, and Blood Institute: PopSteam.is  American Heart Association: www.heart.org  Academy of Nutrition and Dietetics: www.eatright.org  National Kidney Foundation: www.kidney.org Summary  The DASH eating plan is a healthy eating plan that has been shown to reduce high blood pressure (hypertension). It may also reduce your risk for type 2 diabetes, heart disease, and stroke.  When on the DASH eating plan, aim to eat more fresh fruits and vegetables, whole grains, lean proteins, low-fat dairy, and heart-healthy fats.  With the DASH eating plan, you should limit salt (sodium) intake to 2,300 mg a day. If you have hypertension, you may need to reduce your sodium intake to 1,500 mg a day.  Work with your health care provider or dietitian to adjust your eating plan to your individual calorie needs. This information is not intended to replace advice given to you by your health care provider. Make sure you discuss any questions you have with your health care provider. Document Revised: 07/19/2019 Document Reviewed: 07/19/2019 Elsevier Patient Education  2021 Elsevier Inc.    Strategies that help with ADHD  1) To-Do List - Make a daily list of tasks you want to accomplish - break large tasks down into smaller ones that you can do with your attention span - Categorize tasks using ABC system:  ---"A" tasks are of most importance and need to be done more immediately ---"B" tasks may be important, but can wait ---"C" tasks are often easiest, but should not be done first  2) Time Management: set a timer for completing certain tasks. Consider using this to monitor breaks between tasks  3) Delaying Distractibility: When trying to focus on a task, consider  having a small notebook to jot down ideas that interrupt concentration. This way you don't forget something and can redirect attention to the immediate task  4) Mindfulness: Try to do only one thing at a time. Multitasking often leads to mistakes and distractions.   5) Minimize Distractions: Limit potential distractions while you are trying to complete tasks. Quiet place, no background noise, consider turning off your phone.   6) Visual Reminders: Use sticky notes, white board, or small notebook to remind yourself of tasks -- and keep in ONE obvious place  7) Reminders/alerts: Set timers or alerts to reminder yourself of appointments.   8) Calendar/planner: Keep a calendar to maintain your schedule as well as manage your time. Put all deadlines, appointments, etc in the planner and schedule blocks of time for projects at home (exercising, and other goals). Make "appointments" with that time to protect that time for those tasks specifically.   Self-help Materials 1) Resources:  -- Building control surveyor but Scattered Guide to Success: How to Use Your Brains' Executive Skills to Keep Up, [employer], and Get Organized  at Work and at Home by Arita Miss and Rachel -- Out of the Fog: Treatment Options and Coping Strategies for Adult Attention Deficit Disorder by Bettina Gavia and Mickel Fuchs -- Driven to Distraction (Revised): Recognizing and Coping with Attention Deficit Disorder by Antonieta Loveless and Alvie Heidelberg -- Taking Charge of Adult ADHD by Janese Banks -- www.chadd.org and www.PinkCheek.be  2) Deep breathing - can be done in 3-5 minutes throughout the day.  -- Take a deep breath in through the nose (for 4 seconds), noticing yoru stomach expands as you inhale and take along breath out (for 6 seconds) noticing that your stomach deflates as you exhale. Apps like Calm and Breath2Relax provide short, guided breathing exercises.   3) Mindfulness: The goal is to concentrate on the present moment in a  descriptive and non-judgmental manner -- Books: The Mindfulness Prescription for Adult ADHD: An 8-Step Program for Strengthening Your Attention, Managing Emotions, and Achieving Your Goals by Milas Kocher -- Apps: Insight Timer, Headspace, and Buddhify2 -- Websites: www.mindful.org and franticworld.com/free-meditations-from-mindfulness and YouTube videos by Charlene Brooke such as "Three Minute Breathing Space" -- Yoga practice   Healthy Habits 1) Try to maintain a healthy lifestyle -- eating lots of fruits and veggies and getting regular exercise  2) Good Sleep Hygiene Habits -- Got to bed and wake up within an hour of the same time every day -- Avoid bright screens (from laptop, phone, TV) within at least 30 minutes before bed. The "blue light" supresses the sleep hormone melatonin and the content may stimulate as well -- Avoid heavy foods and caffeine within at least 2 hours of bedtime -- Maintain a quiet and dark sleep environment (blackout curtains, turn on a fan or white noise to block out disruptive sounds) -- Practicing relaxing activites before bed (taking a shower, reading a book, journaling, meditation app) -- To quiet a busy mid -- consider journaling before bed (jotting down reminders, worry thoughts, as well as positive things like a gratitude list)

## 2020-12-07 NOTE — Assessment & Plan Note (Addendum)
ADHD less likely as newer issue. PHQ-9 is minimally elevated. Hand out for stratagies. Considered OSA given snoring but epworth was normal. If no improvement anticipate r/o OSA prior to psych consult.

## 2020-12-08 LAB — HEPATITIS C ANTIBODY
Hepatitis C Ab: NONREACTIVE
SIGNAL TO CUT-OFF: 0.01 (ref <1.00)

## 2020-12-21 ENCOUNTER — Other Ambulatory Visit: Payer: Self-pay

## 2020-12-21 ENCOUNTER — Other Ambulatory Visit (INDEPENDENT_AMBULATORY_CARE_PROVIDER_SITE_OTHER): Payer: 59

## 2020-12-21 ENCOUNTER — Encounter: Payer: Self-pay | Admitting: Family Medicine

## 2020-12-21 DIAGNOSIS — I10 Essential (primary) hypertension: Secondary | ICD-10-CM

## 2020-12-21 DIAGNOSIS — R7303 Prediabetes: Secondary | ICD-10-CM | POA: Diagnosis not present

## 2020-12-21 DIAGNOSIS — E782 Mixed hyperlipidemia: Secondary | ICD-10-CM

## 2020-12-21 LAB — COMPREHENSIVE METABOLIC PANEL
ALT: 48 U/L (ref 0–53)
AST: 33 U/L (ref 0–37)
Albumin: 4.4 g/dL (ref 3.5–5.2)
Alkaline Phosphatase: 37 U/L — ABNORMAL LOW (ref 39–117)
BUN: 15 mg/dL (ref 6–23)
CO2: 28 mEq/L (ref 19–32)
Calcium: 9.9 mg/dL (ref 8.4–10.5)
Chloride: 100 mEq/L (ref 96–112)
Creatinine, Ser: 1.16 mg/dL (ref 0.40–1.50)
GFR: 78.04 mL/min (ref >60.00)
Glucose, Bld: 120 mg/dL — ABNORMAL HIGH (ref 70–99)
Potassium: 3.7 mEq/L (ref 3.5–5.1)
Sodium: 137 mEq/L (ref 135–145)
Total Bilirubin: 0.5 mg/dL (ref 0.2–1.2)
Total Protein: 7.4 g/dL (ref 6.0–8.3)

## 2020-12-21 LAB — TSH: TSH: 2.13 u[IU]/mL (ref 0.35–4.50)

## 2020-12-21 LAB — HEMOGLOBIN A1C: Hgb A1c MFr Bld: 5.8 % (ref 4.6–6.5)

## 2020-12-21 LAB — CBC
HCT: 41.3 % (ref 39.0–52.0)
Hemoglobin: 14.1 g/dL (ref 13.0–17.0)
MCHC: 34.2 g/dL (ref 30.0–36.0)
MCV: 89.6 fl (ref 78.0–100.0)
Platelets: 186 10*3/uL (ref 150.0–400.0)
RBC: 4.61 Mil/uL (ref 4.22–5.81)
RDW: 14.8 % (ref 11.5–15.5)
WBC: 4.7 10*3/uL (ref 4.0–10.5)

## 2020-12-21 LAB — LIPID PANEL
Cholesterol: 269 mg/dL — ABNORMAL HIGH (ref 0–200)
HDL: 44.6 mg/dL (ref >39.00)
NonHDL: 224
Total CHOL/HDL Ratio: 6
Triglycerides: 393 mg/dL — ABNORMAL HIGH (ref 0.0–149.0)
VLDL: 78.6 mg/dL — ABNORMAL HIGH (ref 0.0–40.0)

## 2020-12-21 LAB — LDL CHOLESTEROL, DIRECT: Direct LDL: 153 mg/dL

## 2020-12-22 MED ORDER — ATORVASTATIN CALCIUM 10 MG PO TABS: 10.0000 mg | ORAL_TABLET | Freq: Every day | ORAL | 3 refills | Status: DC

## 2021-02-15 ENCOUNTER — Encounter: Payer: 59 | Admitting: Family Medicine

## 2021-03-07 ENCOUNTER — Other Ambulatory Visit: Payer: Self-pay | Admitting: Family Medicine

## 2021-03-07 DIAGNOSIS — I10 Essential (primary) hypertension: Secondary | ICD-10-CM

## 2021-03-09 ENCOUNTER — Other Ambulatory Visit: Payer: Self-pay

## 2021-03-09 ENCOUNTER — Ambulatory Visit (INDEPENDENT_AMBULATORY_CARE_PROVIDER_SITE_OTHER): Payer: 59 | Admitting: Family Medicine

## 2021-03-09 VITALS — BP 130/100 | HR 85 | Temp 97.6°F | Ht 69.5 in | Wt 289.5 lb

## 2021-03-09 DIAGNOSIS — I10 Essential (primary) hypertension: Secondary | ICD-10-CM

## 2021-03-09 DIAGNOSIS — Z0000 Encounter for general adult medical examination without abnormal findings: Secondary | ICD-10-CM | POA: Diagnosis not present

## 2021-03-09 DIAGNOSIS — M5126 Other intervertebral disc displacement, lumbar region: Secondary | ICD-10-CM

## 2021-03-09 DIAGNOSIS — E782 Mixed hyperlipidemia: Secondary | ICD-10-CM

## 2021-03-09 MED ORDER — HYDROCHLOROTHIAZIDE 25 MG PO TABS: 25.0000 mg | ORAL_TABLET | Freq: Every day | ORAL | 3 refills | Status: DC

## 2021-03-09 MED ORDER — LISINOPRIL 10 MG PO TABS: 10.0000 mg | ORAL_TABLET | Freq: Every day | ORAL | 3 refills | Status: DC

## 2021-03-09 NOTE — Progress Notes (Signed)
Annual Exam   Chief Complaint:  Chief Complaint  Patient presents with   Annual Exam    History of Present Illness:  Rodney Ford is a 40 y.o. presents today for annual examination.     Nutrition/Lifestyle Diet: healthy diet, drinking more water Exercise: more active, still with leg numbness He is single partner, contraception - condoms most of the time.  Any issues getting or maintaining an erection? no  Social History   Tobacco Use  Smoking Status Some Days   Pack years: 0.00   Types: Cigars  Smokeless Tobacco Never  Tobacco Comments   3-4 days a week, 2-3 cigars   Social History   Substance and Sexual Activity  Alcohol Use Yes   Comment: 2 times a week   Social History   Substance and Sexual Activity  Drug Use Never     Safety The patient wears seatbelts: yes.     The patient feels safe at home and in their relationships: yes.  General Health Dentist in the last year: Yes Eye doctor: not applicable  Weight Wt Readings from Last 3 Encounters:  03/09/21 289 lb 8 oz (131.3 kg)  12/07/20 299 lb 8 oz (135.9 kg)  02/03/20 288 lb 8 oz (130.9 kg)   Patient has very high BMI  BMI Readings from Last 1 Encounters:  03/09/21 42.14 kg/m     Chronic disease screening Blood pressure monitoring:  BP Readings from Last 3 Encounters:  03/09/21 (!) 130/100  12/07/20 (!) 154/100  02/03/20 (!) 150/108    Lipid Monitoring: Indication for screening: age >35, obesity, diabetes, family hx, CV risk factors.  Lipid screening: Yes  Lab Results  Component Value Date   CHOL 269 (H) 12/21/2020   HDL 44.60 12/21/2020   LDLDIRECT 153.0 12/21/2020   TRIG 393.0 (H) 12/21/2020   CHOLHDL 6 12/21/2020     Diabetes Screening: age >84, overweight, family hx, PCOS, hx of gestational diabetes, at risk ethnicity, elevated blood pressure >135/80.  Diabetes Screening screening: Yes  Lab Results  Component Value Date   HGBA1C 5.8 12/21/2020     Immunization History   Administered Date(s) Administered   PFIZER(Purple Top)SARS-COV-2 Vaccination 11/28/2019, 12/30/2019   Tdap 12/07/2020    No past medical history on file.  Past Surgical History:  Procedure Laterality Date   orthoscopic knee surgery right knee      Prior to Admission medications   Medication Sig Start Date End Date Taking? Authorizing Provider  atorvastatin (LIPITOR) 10 MG tablet Take 1 tablet (10 mg total) by mouth daily. 12/22/20  Yes Lynnda Child, MD  gabapentin (NEURONTIN) 300 MG capsule Take 1 capsule (300 mg total) by mouth 2 (two) times daily. 12/07/20  Yes Lynnda Child, MD  hydrochlorothiazide (HYDRODIURIL) 25 MG tablet Take 1 tablet (25 mg total) by mouth daily. 12/07/20  Yes Lynnda Child, MD  ibuprofen (ADVIL) 200 MG tablet Take 800 mg by mouth every 6 (six) hours as needed.   Yes [provider]    Allergies  Allergen Reactions   Bee Venom      Social History   Socioeconomic History   Marital status: Single    Spouse name: Not on file   Number of children: 0   Years of education: Bachelors   Highest education level: Not on file  Occupational History   Not on file  Tobacco Use   Smoking status: Some Days    Pack years: 0.00    Types: Cigars  Smokeless tobacco: Never   Tobacco comments:    3-4 days a week, 2-3 cigars  Vaping Use   Vaping Use: Never used  Substance and Sexual Activity   Alcohol use: Yes    Comment: 2 times a week   Drug use: Never   Sexual activity: Yes    Birth control/protection: Surgical  Other Topics Concern   Not on file  Social History Narrative   08/12/19   From: [place]    Living: alone   Work: Scientist, 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), Designer, jewellery      Family: brother in [place], parents in [place] (divorced, but good relationship w/ wife)      Enjoys: smoke cigars, work in his [employer], spend time with family      Exercise: not currently   Diet: only meat, no veggies       Safety   Seat belts: Yes    Guns: Yes  and in a secure location   Safe in relationships: Yes    Social Determinants of Corporate investment banker Strain: Not on file  Food Insecurity: Not on file  Transportation Needs: Not on file  Physical Activity: Not on file  Stress: Not on file  Social Connections: Not on file  Intimate Partner Violence: Not on file    Family History  Problem Relation Age of Onset   Anemia Mother    Diabetes Brother    Breast cancer Paternal Grandmother        x 3   Lung cancer Paternal Grandfather     Review of Systems  Constitutional:  Negative for chills and fever.  HENT:  Negative for congestion and sore throat.   Eyes:  Negative for blurred vision and double vision.  Respiratory:  Negative for shortness of breath.   Cardiovascular:  Negative for chest pain.  Gastrointestinal:  Negative for heartburn, nausea and vomiting.  Genitourinary: Negative.   Musculoskeletal: Negative.  Negative for myalgias.  Skin:  Negative for rash.  Neurological:  Negative for dizziness and headaches.  Endo/Heme/Allergies:  Does not bruise/bleed easily.  Psychiatric/Behavioral:  Negative for depression. The patient is not nervous/anxious.     Physical Exam BP (!) 130/100   Pulse 85   Temp 97.6 F (36.4 C) (Temporal)   Ht 5' 9.5" (1.765 m)   Wt 289 lb 8 oz (131.3 kg)   SpO2 97%   BMI 42.14 kg/m    BP Readings from Last 3 Encounters:  03/09/21 (!) 130/100  12/07/20 (!) 154/100  02/03/20 (!) 150/108      Physical Exam Constitutional:      General: He is not in acute distress.    Appearance: He is well-developed. He is not diaphoretic.  HENT:     Head: Normocephalic and atraumatic.     Right Ear: Tympanic membrane and ear canal normal.     Left Ear: Tympanic membrane and ear canal normal.     Nose: Nose normal.     Mouth/Throat:     Pharynx: Uvula midline.  Eyes:     General: No scleral icterus.    Conjunctiva/sclera: Conjunctivae normal.      Pupils: Pupils are equal, round, and reactive to light.  Cardiovascular:     Rate and Rhythm: Normal rate and regular rhythm.     Heart sounds: Normal heart sounds. No murmur heard. Pulmonary:     Effort: Pulmonary effort is normal. No respiratory distress.     Breath sounds: Normal breath sounds. No  wheezing.  Abdominal:     General: Bowel sounds are normal. There is no distension.     Palpations: Abdomen is soft. There is no mass.     Tenderness: There is no abdominal tenderness. There is no guarding.  Musculoskeletal:        General: Normal range of motion.     Cervical back: Normal range of motion and neck supple.  Lymphadenopathy:     Cervical: No cervical adenopathy.  Skin:    General: Skin is warm and dry.     Capillary Refill: Capillary refill takes less than 2 seconds.  Neurological:     Mental Status: He is alert and oriented to person, place, and time.       Results:  PHQ-9:  Depression screen [facility] 2/9 03/09/2021 12/07/2020 08/12/2019  Decreased Interest 0 0 0  Down, Depressed, Hopeless 0 0 0  PHQ - 2 Score 0 0 0       Assessment: 40 y.o. here for routine annual physical examination.  Plan: Problem List Items Addressed This Visit       Cardiovascular and Mediastinum   Essential hypertension    BP elevated. Add lisinopril 10 mg. Return for labs and mychart for home bp in 2-4 weeks. Return for OV in 2 months       Relevant Medications   hydrochlorothiazide (HYDRODIURIL) 25 MG tablet   lisinopril (ZESTRIL) 10 MG tablet   Other Relevant Orders   Basic metabolic panel     Musculoskeletal and Integument   Lumbar herniated disc    Persistent numbness, does not want surgery. Advised PT again. And if ineffective - will plan chiropractor referral.        Relevant Orders   Ambulatory referral to Physical Therapy   Other Visit Diagnoses     Annual physical exam    -  Primary   Mixed hyperlipidemia       Relevant Medications   hydrochlorothiazide  (HYDRODIURIL) 25 MG tablet   lisinopril (ZESTRIL) 10 MG tablet   Other Relevant Orders   Lipid panel       Screening: -- Blood pressure screen elevated: continued to monitor. -- cholesterol screening: will obtain -- Weight screening: obese: discussed management options, including lifestyle, dietary, and exercise. -- Diabetes Screening: not due for screening -- Nutrition: Encouraged healthy diet and exercise  The 10-year ASCVD risk score Denman George DC Jr., et al., 2013) is: 11.4%   Values used to calculate the score:     Age: 48 years     Sex: Male     Is Non-Hispanic African American: Yes     Diabetic: No     Tobacco smoker: Yes     Systolic Blood Pressure: 130 mmHg     Is BP treated: Yes     HDL Cholesterol: 44.6 mg/dL     Total Cholesterol: 269 mg/dL  -- Statin therapy for Age 21-75 with CVD risk >7.5%  Psych -- Depression screening (PHQ-9):    Safety -- tobacco screening:  has cut back on cigar drinking  -- alcohol screening:  low-risk usage. -- no evidence of domestic violence or intimate partner violence.   Cancer Screening -- No age related cancer screening due  Immunizations Immunization History  Administered Date(s) Administered   PFIZER(Purple Top)SARS-COV-2 Vaccination 11/28/2019, 12/30/2019   Tdap 12/07/2020    -- flu vaccine not up to date - due in fall -- TDAP q10 years up to date -- Covid-19 Vaccine up to date   Encouraged regular  vision and dental screening. Encouraged healthy exercise and diet.   Lynnda ChildJessica R Geralda Baumgardner

## 2021-03-09 NOTE — Patient Instructions (Addendum)
Start lisinopril Continue other medications  Return for labs in 2-4 weeks  Mychart in 2-4 weeks with home blood pressure readings  #Referral to physical therapy I have placed a referral to a specialist for you. You should receive a phone call from the specialty office. Make sure your voicemail is not full and that if you are able to answer your phone to unknown or new numbers.   It may take up to 2 weeks to hear about the referral. If you do not hear anything in 2 weeks, please call our office and ask to speak with the referral coordinator.

## 2021-03-09 NOTE — Assessment & Plan Note (Signed)
BP elevated. Add lisinopril 10 mg. Return for labs and mychart for home bp in 2-4 weeks. Return for OV in 2 months

## 2021-03-09 NOTE — Assessment & Plan Note (Signed)
Persistent numbness, does not want surgery. Advised PT again. And if ineffective - will plan chiropractor referral.

## 2021-03-24 ENCOUNTER — Encounter: Payer: Self-pay | Admitting: Family Medicine

## 2021-03-30 ENCOUNTER — Other Ambulatory Visit (INDEPENDENT_AMBULATORY_CARE_PROVIDER_SITE_OTHER): Payer: 59

## 2021-03-30 ENCOUNTER — Other Ambulatory Visit: Payer: Self-pay

## 2021-03-30 DIAGNOSIS — E782 Mixed hyperlipidemia: Secondary | ICD-10-CM | POA: Diagnosis not present

## 2021-03-30 DIAGNOSIS — I10 Essential (primary) hypertension: Secondary | ICD-10-CM | POA: Diagnosis not present

## 2021-03-30 LAB — LIPID PANEL
Cholesterol: 253 mg/dL — ABNORMAL HIGH (ref 0–200)
HDL: 46.3 mg/dL (ref >39.00)
Total CHOL/HDL Ratio: 5
Triglycerides: 546 mg/dL — ABNORMAL HIGH (ref 0.0–149.0)

## 2021-03-30 LAB — BASIC METABOLIC PANEL
BUN: 11 mg/dL (ref 6–23)
CO2: 27 mEq/L (ref 19–32)
Calcium: 9.7 mg/dL (ref 8.4–10.5)
Chloride: 99 mEq/L (ref 96–112)
Creatinine, Ser: 1.12 mg/dL (ref 0.40–1.50)
GFR: 81.24 mL/min (ref >60.00)
Glucose, Bld: 90 mg/dL (ref 70–99)
Potassium: 3.9 mEq/L (ref 3.5–5.1)
Sodium: 136 mEq/L (ref 135–145)

## 2021-03-30 LAB — LDL CHOLESTEROL, DIRECT: Direct LDL: 122 mg/dL

## 2021-05-10 ENCOUNTER — Ambulatory Visit (INDEPENDENT_AMBULATORY_CARE_PROVIDER_SITE_OTHER): Payer: 59 | Admitting: Family Medicine

## 2021-05-10 ENCOUNTER — Other Ambulatory Visit: Payer: Self-pay

## 2021-05-10 VITALS — BP 130/88 | HR 74 | Temp 97.0°F | Ht 70.0 in | Wt 286.0 lb

## 2021-05-10 DIAGNOSIS — I10 Essential (primary) hypertension: Secondary | ICD-10-CM | POA: Diagnosis not present

## 2021-05-10 MED ORDER — LISINOPRIL-HYDROCHLOROTHIAZIDE 10-12.5 MG PO TABS: 1.0000 | ORAL_TABLET | Freq: Every day | ORAL | 3 refills | Status: DC

## 2021-05-10 NOTE — Assessment & Plan Note (Signed)
BP improved, though pt has not been taking HCTZ. Discussed option of single agent vs close control. Will focus on close control and start Lisinopril-hctz 10-12.5 mg. Return 6 months or sooner if home monitoring is trending up. Cont exercise and reducing smoking.

## 2021-05-10 NOTE — Patient Instructions (Signed)
Hypertension - stop Lisinopril and Hydrochlorothiazide - start the combination pill with Lisinopril-Hydrochlorothiazide   Come in sooner if blood pressure consistently >140/90  Ideal range 110-135/65-85  Your blood pressure high.   High blood pressure increases your risk for heart attack and stroke.    Please check your blood pressure 2-4 times a week.   To check your blood pressure 1) Sit in a quiet and relaxed place for 5 minutes 2) Make sure your feet are flat on the ground 3) Consider checking first thing in the morning   Normal blood pressure is less than 140/90 Ideally you blood pressure should be around 120/80  Other ways you can reduce your blood pressure:  1) Regular exercise -- Try to get 150 minutes (30 minutes, 5 days a week) of moderate to vigorous aerobic excercise -- Examples: brisk walking (2.5 miles per hour), water aerobics, dancing, gardening, tennis, biking slower than 10 miles per hour 2) DASH Diet - low fat meats, more fresh fruits and vegetables, whole grains, low salt 3) Quit smoking if you smoke 4) Loose 5-10% of your body weight

## 2021-05-10 NOTE — Progress Notes (Signed)
   Subjective:     Rodney Ford is a 40 y.o. male presenting for Follow-up (2 mo HT )     HPI  #HTN - lost 5 lbs at home - weighing in the morning - has been out of HCTZ - is taking the lisinopril - bp at the dentist was 125/88 -   Review of Systems   Social History   Tobacco Use  Smoking Status Some Days   Types: Cigars  Smokeless Tobacco Never  Tobacco Comments   3-4 days a week, 2-3 cigars        Objective:    BP Readings from Last 3 Encounters:  05/10/21 130/88  03/09/21 (!) 130/100  12/07/20 (!) 154/100   Wt Readings from Last 3 Encounters:  05/10/21 286 lb (129.7 kg)  03/09/21 289 lb 8 oz (131.3 kg)  12/07/20 299 lb 8 oz (135.9 kg)    BP 130/88   Pulse 74   Temp (!) 97 F (36.1 C) (Temporal)   Ht 5\' 10"  (1.778 m)   Wt 286 lb (129.7 kg)   SpO2 97%   BMI 41.04 kg/m    Physical Exam Constitutional:      Appearance: Normal appearance. He is not ill-appearing or diaphoretic.  HENT:     Right Ear: External ear normal.     Left Ear: External ear normal.  Eyes:     General: No scleral icterus.    Extraocular Movements: Extraocular movements intact.     Conjunctiva/sclera: Conjunctivae normal.  Cardiovascular:     Rate and Rhythm: Normal rate and regular rhythm.     Heart sounds: No murmur heard. Pulmonary:     Effort: Pulmonary effort is normal. No respiratory distress.     Breath sounds: Normal breath sounds. No wheezing.  Musculoskeletal:     Cervical back: Neck supple.  Skin:    General: Skin is warm and dry.  Neurological:     Mental Status: He is alert. Mental status is at baseline.  Psychiatric:        Mood and Affect: Mood normal.          Assessment & Plan:   Problem List Items Addressed This Visit       Cardiovascular and Mediastinum   Essential hypertension - Primary    BP improved, though pt has not been taking HCTZ. Discussed option of single agent vs close control. Will focus on close control and start  Lisinopril-hctz 10-12.5 mg. Return 6 months or sooner if home monitoring is trending up. Cont exercise and reducing smoking.       Relevant Medications   lisinopril-hydrochlorothiazide (ZESTORETIC) 10-12.5 MG tablet     Return in about 6 months (around 11/07/2021) for hypertension.  01/07/2022, MD  This visit occurred during the SARS-CoV-2 public health emergency.  Safety protocols were in place, including screening questions prior to the visit, additional usage of staff PPE, and extensive cleaning of exam room while observing appropriate contact time as indicated for disinfecting solutions.

## 2021-10-28 ENCOUNTER — Encounter: Payer: Self-pay | Admitting: Family Medicine

## 2021-11-02 ENCOUNTER — Encounter: Payer: Self-pay | Admitting: Family Medicine

## 2021-11-03 ENCOUNTER — Encounter: Payer: Self-pay | Admitting: Nurse Practitioner

## 2021-11-03 ENCOUNTER — Other Ambulatory Visit: Payer: Self-pay

## 2021-11-03 ENCOUNTER — Ambulatory Visit (INDEPENDENT_AMBULATORY_CARE_PROVIDER_SITE_OTHER): Payer: 59 | Admitting: Nurse Practitioner

## 2021-11-03 VITALS — BP 144/90 | HR 91 | Temp 96.6°F | Resp 14 | Ht 70.0 in | Wt 293.2 lb

## 2021-11-03 DIAGNOSIS — M5126 Other intervertebral disc displacement, lumbar region: Secondary | ICD-10-CM

## 2021-11-03 DIAGNOSIS — M5431 Sciatica, right side: Secondary | ICD-10-CM | POA: Diagnosis not present

## 2021-11-03 MED ORDER — PREDNISONE 20 MG PO TABS: ORAL_TABLET | ORAL | 0 refills | Status: AC

## 2021-11-03 NOTE — Patient Instructions (Signed)
Nice to see you today ?You can do acetaminophen 1,000mg  every 8 hours as needed for the discomfort ?I sent in some prednisone for you to help with the inflammation and pain  ?Let me know if this does not start to help ? ?

## 2021-11-03 NOTE — Assessment & Plan Note (Signed)
History of the same has been evaluated by PT neurosurgery.  Currently maintained on gabapentin ?

## 2021-11-03 NOTE — Progress Notes (Signed)
? ?Acute Office Visit ? ?Subjective:  ? ? Patient ID: Rodney Ford, male    DOB: Dec 24, 1980, 41 y.o.   MRN: [id number] ? ?Chief Complaint  ?Patient presents with  ? Back Pain  ?  Started on 10/27/21, felt some tightness in the lower back on 10/26/21, then played golf on 10/27/21 and since then has had pain in the lower right back area, right buttocks and leg. Has taking Ibuprofen. Went to urgent Care on 10/30/21-was given Meloxicam 15 mg, Orphenadrine 100 mg, steroid shot.   ? ? ? ?Patient is in today for  back pain ? ?Started on Wednesday last week and then when he played golf he felt it in the back. States that he went and got medicine. States called friday and could not get in. [facility] him a steroid shot. Muscle relaxer and meloxicam Starting on Sunday the pain started returning. States he has tried ibuprofen muscle relaxer and tylenol with little relief. ?States that a constant pain that is going down the right posterior leg. Does have a history of herniated disc. ?Sitting makes it worse ? ?No past medical history on file. ? ?Past Surgical History:  ?Procedure Laterality Date  ? orthoscopic knee surgery right knee    ? ? ?Family History  ?Problem Relation Age of Onset  ? Anemia Mother   ? Diabetes Brother   ? Breast cancer Paternal Grandmother   ?     x 3  ? Lung cancer Paternal Grandfather   ? ? ?Social History  ? ?Socioeconomic History  ? Marital status: Single  ?  Spouse name: Not on file  ? Number of children: 0  ? Years of education: Bachelors  ? Highest education level: Not on file  ?Occupational History  ? Not on file  ?Tobacco Use  ? Smoking status: Some Days  ?  Types: Cigars  ? Smokeless tobacco: Never  ? Tobacco comments:  ?  3-4 days a week, 2-3 cigars  ?Vaping Use  ? Vaping Use: Never used  ?Substance and Sexual Activity  ? Alcohol use: Yes  ?  Comment: 2 times a week  ? Drug use: Never  ? Sexual activity: Yes  ?  Birth control/protection: Surgical  ?Other Topics  Concern  ? Not on file  ?Social History Narrative  ? 08/12/19  ? [place]   ? Living: alone  ? 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   ? Family: brother in [place], parents in [place] (divorced, but good relationship w/ wife)  ?   ? Enjoys: smoke cigars, work in his [employer], spend time with family  ?   ? Exercise: not currently  ? Diet: only meat, no veggies  ?   ? Safety  ? Seat belts: Yes   ? Guns: Yes  and in a secure location  ? Safe in relationships: Yes   ? ?Social Determinants of Health  ? ?Financial Resource Strain: Not on file  ?Food Insecurity: Not on file  ?Transportation Needs: Not on file  ?Physical Activity: Not on file  ?Stress: Not on file  ?Social Connections: Not on file  ?Intimate Partner Violence: Not on file  ? ? ?Outpatient Medications Prior to Visit  ?Medication Sig Dispense Refill  ? atorvastatin (LIPITOR) 10 MG tablet Take 1 tablet (10 mg total) by mouth daily. 90 tablet 3  ? gabapentin (NEURONTIN) 300 MG capsule Take 1 capsule (300 mg total)  by mouth 2 (two) times daily. 60 capsule 5  ? ibuprofen (ADVIL) 200 MG tablet Take 800 mg by mouth every 6 (six) hours as needed.    ? lisinopril-hydrochlorothiazide (ZESTORETIC) 10-12.5 MG tablet Take 1 tablet by mouth daily. 90 tablet 3  ? meloxicam (MOBIC) 15 MG tablet Take by mouth.    ? orphenadrine (NORFLEX) 100 MG tablet Take 100 mg by mouth 2 (two) times daily as needed.    ? ?No facility-administered medications prior to visit.  ? ? ?Allergies  ?Allergen Reactions  ? Bee Venom   ? ? ?Review of Systems  ?Constitutional:  Negative for chills and fever.  ?Respiratory:  Negative for shortness of breath.   ?Cardiovascular:  Negative for chest pain.  ?Genitourinary:  Negative for difficulty urinating.  ?     No B&B invovlement   ?Musculoskeletal:  Positive for back pain.  ?Neurological:  Positive for numbness (baseline). Negative for weakness.  ? ?   ?Objective:  ?  ?Physical Exam ?Vitals and  nursing note reviewed.  ?Constitutional:   ?   Appearance: He is obese.  ?Cardiovascular:  ?   Rate and Rhythm: Normal rate and regular rhythm.  ?   Heart sounds: Normal heart sounds.  ?Pulmonary:  ?   Effort: Pulmonary effort is normal.  ?   Breath sounds: Normal breath sounds.  ?Musculoskeletal:  ?   Lumbar back: No tenderness or bony tenderness. Negative right straight leg raise test and negative left straight leg raise test.  ?   Right lower leg: No edema.  ?   Left lower leg: No edema.  ?     Legs: ? ?   Comments: Some "tightness" with right leg raise  ?Neurological:  ?   General: No focal deficit present.  ?   Mental Status: He is alert.  ?   Motor: No weakness.  ?   Gait: Gait normal.  ?   Deep Tendon Reflexes:  ?   Reflex Scores: ?     Bicep reflexes are 2+ on the right side and 2+ on the left side. ?   Comments: Bilateral lower extremities strength 5/5  ? ? ?BP (!) 144/90   Pulse 91   Temp (!) 96.6 ?F (35.9 ?C)   Resp 14   Ht 5\' 10"  (1.778 m)   Wt 293 lb 4 oz (133 kg)   SpO2 98%   BMI 42.08 kg/m?  ?Wt Readings from Last 3 Encounters:  ?11/03/21 293 lb 4 oz (133 kg)  ?05/10/21 286 lb (129.7 kg)  ?03/09/21 289 lb 8 oz (131.3 kg)  ? ? ?Health Maintenance Due  ?Topic Date Due  ? COVID-19 Vaccine (3 - Booster for Pfizer series) 02/24/2020  ? ? ?There are no preventive care reminders to display for this patient. ? ? ?Lab Results  ?Component Value Date  ? TSH 2.13 12/21/2020  ? ?Lab Results  ?Component Value Date  ? WBC 4.7 12/21/2020  ? HGB 14.1 12/21/2020  ? HCT 41.3 12/21/2020  ? MCV 89.6 12/21/2020  ? PLT 186.0 12/21/2020  ? ?Lab Results  ?Component Value Date  ? NA 136 03/30/2021  ? K 3.9 03/30/2021  ? CO2 27 03/30/2021  ? GLUCOSE 90 03/30/2021  ? BUN 11 03/30/2021  ? CREATININE 1.12 03/30/2021  ? BILITOT 0.5 12/21/2020  ? ALKPHOS 37 (L) 12/21/2020  ? AST 33 12/21/2020  ? ALT 48 12/21/2020  ? PROT 7.4 12/21/2020  ? ALBUMIN 4.4 12/21/2020  ? CALCIUM 9.7 03/30/2021  ?  GFR 81.24 03/30/2021  ? ?Lab Results   ?Component Value Date  ? CHOL 253 (H) 03/30/2021  ? ?Lab Results  ?Component Value Date  ? HDL 46.30 03/30/2021  ? ?No results found for: LDLCALC ?Lab Results  ?Component Value Date  ? TRIG (H) 03/30/2021  ?  546.0 Triglyceride is over 400; calculations on Lipids are invalid.  ? ?Lab Results  ?Component Value Date  ? CHOLHDL 5 03/30/2021  ? ?Lab Results  ?Component Value Date  ? HGBA1C 5.8 12/21/2020  ? ? ?   ?Assessment & Plan:  ? ?Problem List Items Addressed This Visit   ? ?  ? Nervous and Auditory  ? Right sided sciatica - Primary  ?  Exam consistent with sciatica.  Patient did have some moderate relief after getting a dexamethasone injection at urgent care.  We will place patient on prednisone 20 mg tapering.  Avoid NSAIDs.  He can use over-the-counter Tylenol as needed.  He states he does avoid opiates.  Offered to image patient he politely declined.  Agree with his decision is no bony tenderness. ?  ?  ? Relevant Medications  ? orphenadrine (NORFLEX) 100 MG tablet  ? predniSONE (DELTASONE) 20 MG tablet  ?  ? Musculoskeletal and Integument  ? Lumbar herniated disc  ?  History of the same has been evaluated by PT neurosurgery.  Currently maintained on gabapentin ?  ?  ? ? ? ?No orders of the defined types were placed in this encounter. ? ?This visit occurred during the SARS-CoV-2 public health emergency.  Safety protocols were in place, including screening questions prior to the visit, additional usage of staff PPE, and extensive cleaning of exam room while observing appropriate contact time as indicated for disinfecting solutions.  ? ?Audria Nine, NP ? ?

## 2021-11-03 NOTE — Assessment & Plan Note (Signed)
Exam consistent with sciatica.  Patient did have some moderate relief after getting a dexamethasone injection at urgent care.  We will place patient on prednisone 20 mg tapering.  Avoid NSAIDs.  He can use over-the-counter Tylenol as needed.  He states he does avoid opiates.  Offered to image patient he politely declined.  Agree with his decision is no bony tenderness. ?

## 2021-12-16 ENCOUNTER — Ambulatory Visit: Payer: 59 | Admitting: Family Medicine

## 2021-12-29 ENCOUNTER — Other Ambulatory Visit: Payer: Self-pay | Admitting: Family Medicine

## 2021-12-29 DIAGNOSIS — M5126 Other intervertebral disc displacement, lumbar region: Secondary | ICD-10-CM

## 2022-01-08 ENCOUNTER — Ambulatory Visit: Payer: 59

## 2022-01-11 NOTE — Telephone Encounter (Signed)
Is this okay to refill   ? ?Last ov 11/03/21 ?Next ov none ?Last filled 11/13/21 ?

## 2022-02-07 ENCOUNTER — Other Ambulatory Visit: Payer: Self-pay | Admitting: Family Medicine

## 2022-02-07 DIAGNOSIS — E782 Mixed hyperlipidemia: Secondary | ICD-10-CM

## 2022-02-25 ENCOUNTER — Encounter: Payer: Self-pay | Admitting: Family Medicine

## 2022-02-25 ENCOUNTER — Ambulatory Visit (INDEPENDENT_AMBULATORY_CARE_PROVIDER_SITE_OTHER): Payer: 59 | Admitting: Family Medicine

## 2022-02-25 VITALS — BP 108/70 | HR 88 | Temp 97.7°F | Ht 70.0 in | Wt 281.1 lb

## 2022-02-25 DIAGNOSIS — M1A4610 Other secondary chronic gout, right knee, without tophus (tophi): Secondary | ICD-10-CM | POA: Diagnosis not present

## 2022-02-25 DIAGNOSIS — R7303 Prediabetes: Secondary | ICD-10-CM

## 2022-02-25 DIAGNOSIS — E782 Mixed hyperlipidemia: Secondary | ICD-10-CM | POA: Insufficient documentation

## 2022-02-25 DIAGNOSIS — M1A00X0 Idiopathic chronic gout, unspecified site, without tophus (tophi): Secondary | ICD-10-CM

## 2022-02-25 DIAGNOSIS — M10262 Drug-induced gout, left knee: Secondary | ICD-10-CM | POA: Diagnosis not present

## 2022-02-25 DIAGNOSIS — I10 Essential (primary) hypertension: Secondary | ICD-10-CM

## 2022-02-25 LAB — COMPREHENSIVE METABOLIC PANEL
ALT: 55 U/L — ABNORMAL HIGH (ref 0–53)
AST: 26 U/L (ref 0–37)
Albumin: 4.9 g/dL (ref 3.5–5.2)
Alkaline Phosphatase: 47 U/L (ref 39–117)
BUN: 17 mg/dL (ref 6–23)
CO2: 27 mEq/L (ref 19–32)
Calcium: 10.2 mg/dL (ref 8.4–10.5)
Chloride: 101 mEq/L (ref 96–112)
Creatinine, Ser: 1.21 mg/dL (ref 0.40–1.50)
GFR: 73.57 mL/min (ref >60.00)
Glucose, Bld: 97 mg/dL (ref 70–99)
Potassium: 4.4 mEq/L (ref 3.5–5.1)
Sodium: 136 mEq/L (ref 135–145)
Total Bilirubin: 0.6 mg/dL (ref 0.2–1.2)
Total Protein: 7.8 g/dL (ref 6.0–8.3)

## 2022-02-25 LAB — URIC ACID: Uric Acid, Serum: 8.7 mg/dL — ABNORMAL HIGH (ref 4.0–7.8)

## 2022-02-25 LAB — LIPID PANEL
Cholesterol: 254 mg/dL — ABNORMAL HIGH (ref 0–200)
HDL: 52.3 mg/dL (ref >39.00)
NonHDL: 201.31
Total CHOL/HDL Ratio: 5
Triglycerides: 216 mg/dL — ABNORMAL HIGH (ref 0.0–149.0)
VLDL: 43.2 mg/dL — ABNORMAL HIGH (ref 0.0–40.0)

## 2022-02-25 LAB — HEMOGLOBIN A1C: Hgb A1c MFr Bld: 6 % (ref 4.6–6.5)

## 2022-02-25 LAB — LDL CHOLESTEROL, DIRECT: Direct LDL: 151 mg/dL

## 2022-02-25 MED ORDER — LISINOPRIL 10 MG PO TABS: 10.0000 mg | ORAL_TABLET | Freq: Every day | ORAL | 3 refills | Status: AC

## 2022-02-25 MED ORDER — ALLOPURINOL 100 MG PO TABS: 100.0000 mg | ORAL_TABLET | Freq: Every day | ORAL | 0 refills | Status: DC

## 2022-02-25 MED ORDER — PREDNISONE 20 MG PO TABS: ORAL_TABLET | ORAL | 0 refills | Status: AC

## 2022-02-25 MED ORDER — ATORVASTATIN CALCIUM 40 MG PO TABS: 40.0000 mg | ORAL_TABLET | Freq: Every day | ORAL | 3 refills | Status: DC

## 2022-02-25 NOTE — Assessment & Plan Note (Signed)
He has had multiple effusions drained and steroid injections over the last couple of months.  Discussed oral steroids to treat his acute gout flare.

## 2022-02-25 NOTE — Progress Notes (Signed)
Subjective:     Rodney Ford is a 41 y.o. male presenting for Gout (In both knees)     HPI  #Gout - had the left knee drained on May 13 - [facility] no testing - Right knee on June 3rd - [facility] weiner at urgent care - 126 cc - suggested it might be gout came back as gout - dramatic change in diet - cut all red meat, avoiding white bread, tuna - was playing golf - started feeling sciatic pain and restarted gabapentin   Had chicken wings on the weekend Wednesday left knee started swelling Took ibuprofen yesterday w/o improvement in pain or swelling   Review of Systems   Social History   Tobacco Use  Smoking Status Some Days   Types: Cigars  Smokeless Tobacco Never  Tobacco Comments   3-4 days a week, 2-3 cigars        Objective:    BP Readings from Last 3 Encounters:  02/25/22 108/70  11/03/21 (!) 144/90  05/10/21 130/88   Wt Readings from Last 3 Encounters:  02/25/22 281 lb 2 oz (127.5 kg)  11/03/21 293 lb 4 oz (133 kg)  05/10/21 286 lb (129.7 kg)    BP 108/70   Pulse 88   Temp 97.7 F (36.5 C) (Temporal)   Ht 5\' 10"  (1.778 m)   Wt 281 lb 2 oz (127.5 kg)   SpO2 97%   BMI 40.34 kg/m    Physical Exam Constitutional:      Appearance: Normal appearance. He is not ill-appearing or diaphoretic.  HENT:     Right Ear: External ear normal.     Left Ear: External ear normal.     Nose: Nose normal.  Eyes:     General: No scleral icterus.    Extraocular Movements: Extraocular movements intact.     Conjunctiva/sclera: Conjunctivae normal.  Cardiovascular:     Rate and Rhythm: Normal rate and regular rhythm.  Pulmonary:     Effort: Pulmonary effort is normal. No respiratory distress.     Breath sounds: Normal breath sounds.  Musculoskeletal:     Cervical back: Neck supple.     Comments: Left knee swelling  Skin:    General: Skin is warm and dry.  Neurological:     Mental Status: He is alert. Mental status is at baseline.  Psychiatric:         Mood and Affect: Mood normal.           Assessment & Plan:   Problem List Items Addressed This Visit       Cardiovascular and Mediastinum   Essential hypertension    Controlled.  Since his blood pressure is well controlled, will continue lisinopril 10 mg alone.  Stop hydrochlorothiazide as possibility that this is contributing to his recurrent gout.      Relevant Medications   lisinopril (ZESTRIL) 10 MG tablet   Other Relevant Orders   Comprehensive metabolic panel   Ambulatory referral to Nutrition and Diabetic Education     Musculoskeletal and Integument   Acute drug-induced gout of left knee - Primary    He has had multiple effusions drained and steroid injections over the last couple of months.  Discussed oral steroids to treat his acute gout flare.      Relevant Medications   predniSONE (DELTASONE) 20 MG tablet   allopurinol (ZYLOPRIM) 100 MG tablet     Other   Chronic gout    Discussed hydrochlorothiazide in addition to his  diet may have been contributing.  Offered trial of just stopping hydrochlorothiazide, but he prefers to initiate treatment.  We will start allopurinol 100 mg after he completes prednisone and the current flare resolves.  He will return in approximately 4 weeks for blood pressure check and to recheck uric acid.      Relevant Orders   Ambulatory referral to Nutrition and Diabetic Education   Uric Acid   Morbid obesity (HCC)    He has had some weight loss with healthy diet choices.  Encouraged continuing to do the following.        Relevant Orders   Ambulatory referral to Nutrition and Diabetic Education   Prediabetes   Relevant Orders   Hemoglobin A1c   Ambulatory referral to Nutrition and Diabetic Education   Mixed hyperlipidemia    He is fasting today, recheck labs.  Continue atorvastatin 10 mg      Relevant Medications   lisinopril (ZESTRIL) 10 MG tablet   Other Relevant Orders   Lipid panel   Ambulatory referral to Nutrition and  Diabetic Education     Return in about 4 weeks (around 03/25/2022) for blood pressure check.  Lynnda Child, MD

## 2022-02-25 NOTE — Assessment & Plan Note (Signed)
He has had some weight loss with healthy diet choices.  Encouraged continuing to do the following.

## 2022-02-25 NOTE — Assessment & Plan Note (Signed)
Controlled.  Since his blood pressure is well controlled, will continue lisinopril 10 mg alone.  Stop hydrochlorothiazide as possibility that this is contributing to his recurrent gout.

## 2022-02-25 NOTE — Assessment & Plan Note (Signed)
He is fasting today, recheck labs.  Continue atorvastatin 10 mg

## 2022-02-25 NOTE — Assessment & Plan Note (Signed)
Discussed hydrochlorothiazide in addition to his diet may have been contributing.  Offered trial of just stopping hydrochlorothiazide, but he prefers to initiate treatment.  We will start allopurinol 100 mg after he completes prednisone and the current flare resolves.  He will return in approximately 4 weeks for blood pressure check and to recheck uric acid.

## 2022-02-25 NOTE — Patient Instructions (Addendum)
Labs today  Take prednsione - update if no improvement by next week Once finished prednisone and no longer in Gout flare - then Start Allopurinol   Return in 4 weeks   Stop combination blood pressure Start lisinopril alone   #Referral I have placed a referral to a specialist for you. You should receive a phone call from the specialty office. Make sure your voicemail is not full and that if you are able to answer your phone to unknown or new numbers.   It may take up to 2 weeks to hear about the referral. If you do not hear anything in 2 weeks, please call our office and ask to speak with the referral coordinator.  - nutritionist   DASH Eating Plan DASH stands for Dietary Approaches to Stop Hypertension. The DASH eating plan is a healthy eating plan that has been shown to: Reduce high blood pressure (hypertension). Reduce your risk for type 2 diabetes, heart disease, and stroke. Help with weight loss. What are tips for following this plan? Reading food labels Check food labels for the amount of salt (sodium) per serving. Choose foods with less than 5 percent of the Daily Value of sodium. Generally, foods with less than 300 milligrams (mg) of sodium per serving fit into this eating plan. To find whole grains, look for the word "whole" as the first word in the ingredient list. Shopping Buy products labeled as "low-sodium" or "no salt added." Buy fresh foods. Avoid canned foods and pre-made or frozen meals. Cooking Avoid adding salt when cooking. Use salt-free seasonings or herbs instead of table salt or sea salt. Check with your health care provider or pharmacist before using salt substitutes. Do not fry foods. Cook foods using healthy methods such as baking, boiling, grilling, roasting, and broiling instead. Cook with heart-healthy oils, such as olive, canola, avocado, soybean, or sunflower oil. Meal planning  Eat a balanced diet that includes: 4 or more servings of fruits and 4 or  more servings of vegetables each day. Try to fill one-half of your plate with fruits and vegetables. 6-8 servings of whole grains each day. Less than 6 oz (170 g) of lean meat, poultry, or fish each day. A 3-oz (85-g) serving of meat is about the same size as a deck of cards. One egg equals 1 oz (28 g). 2-3 servings of low-fat dairy each day. One serving is 1 cup (237 mL). 1 serving of nuts, seeds, or beans 5 times each week. 2-3 servings of heart-healthy fats. Healthy fats called omega-3 fatty acids are found in foods such as walnuts, flaxseeds, fortified milks, and eggs. These fats are also found in cold-water fish, such as sardines, salmon, and mackerel. Limit how much you eat of: Canned or prepackaged foods. Food that is high in trans fat, such as some fried foods. Food that is high in saturated fat, such as fatty meat. Desserts and other sweets, sugary drinks, and other foods with added sugar. Full-fat dairy products. Do not salt foods before eating. Do not eat more than 4 egg yolks a week. Try to eat at least 2 vegetarian meals a week. Eat more home-cooked food and less restaurant, buffet, and fast food. Lifestyle When eating at a restaurant, ask that your food be prepared with less salt or no salt, if possible. If you drink alcohol: Limit how much you use to: 0-1 drink a day for women who are not pregnant. 0-2 drinks a day for men. Be aware of how much alcohol  is in your drink. In the [place] one drink equals one 12 oz bottle of beer (355 mL), one 5 oz glass of wine (148 mL), or one 1 oz glass of hard liquor (44 mL). General information Avoid eating more than 2,300 mg of salt a day. If you have hypertension, you may need to reduce your sodium intake to 1,500 mg a day. Work with your health care provider to maintain a healthy body weight or to lose weight. Ask what an ideal weight is for you. Get at least 30 minutes of exercise that causes your heart to beat faster (aerobic exercise)  most days of the week. Activities may include walking, swimming, or biking. Work with your health care provider or dietitian to adjust your eating plan to your individual calorie needs. What foods should I eat? Fruits All fresh, dried, or frozen fruit. Canned fruit in natural juice (without added sugar). Vegetables Fresh or frozen vegetables (raw, steamed, roasted, or grilled). Low-sodium or reduced-sodium tomato and vegetable juice. Low-sodium or reduced-sodium tomato sauce and tomato paste. Low-sodium or reduced-sodium canned vegetables. Grains Whole-grain or whole-wheat bread. Whole-grain or whole-wheat pasta. Brown rice. Orpah Cobb. Bulgur. Whole-grain and low-sodium cereals. Pita bread. Low-fat, low-sodium crackers. Whole-wheat flour tortillas. Meats and other proteins Skinless chicken or [place]. Ground chicken or [place]. Pork with fat trimmed off. Fish and seafood. Egg whites. Dried beans, peas, or lentils. Unsalted nuts, nut butters, and seeds. Unsalted canned beans. Lean cuts of beef with fat trimmed off. Low-sodium, lean precooked or cured meat, such as sausages or meat loaves. Dairy Low-fat (1%) or fat-free (skim) milk. Reduced-fat, low-fat, or fat-free cheeses. Nonfat, low-sodium ricotta or cottage cheese. Low-fat or nonfat yogurt. Low-fat, low-sodium cheese. Fats and oils Soft margarine without trans fats. Vegetable oil. Reduced-fat, low-fat, or light mayonnaise and salad dressings (reduced-sodium). Canola, safflower, olive, avocado, soybean, and sunflower oils. Avocado. Seasonings and condiments Herbs. Spices. Seasoning mixes without salt. Other foods Unsalted popcorn and pretzels. Fat-free sweets. The items listed above may not be a complete list of foods and beverages you can eat. Contact a dietitian for more information. What foods should I avoid? Fruits Canned fruit in a light or heavy syrup. Fried fruit. Fruit in cream or butter sauce. Vegetables Creamed or fried  vegetables. Vegetables in a cheese sauce. Regular canned vegetables (not low-sodium or reduced-sodium). Regular canned tomato sauce and paste (not low-sodium or reduced-sodium). Regular tomato and vegetable juice (not low-sodium or reduced-sodium). Rosita Fire. Olives. Grains Baked goods made with fat, such as croissants, muffins, or some breads. Dry pasta or rice meal packs. Meats and other proteins Fatty cuts of meat. Ribs. Fried meat. Tomasa Blase. Bologna, salami, and other precooked or cured meats, such as sausages or meat loaves. Fat from the back of a pig (fatback). Bratwurst. Salted nuts and seeds. Canned beans with added salt. Canned or smoked fish. Whole eggs or egg yolks. Chicken or [place] with skin. Dairy Whole or 2% milk, cream, and half-and-half. Whole or full-fat cream cheese. Whole-fat or sweetened yogurt. Full-fat cheese. Nondairy creamers. Whipped toppings. Processed cheese and cheese spreads. Fats and oils Butter. Stick margarine. Lard. Shortening. Ghee. Bacon fat. Tropical oils, such as coconut, palm kernel, or palm oil. Seasonings and condiments Onion salt, garlic salt, seasoned salt, table salt, and sea salt. Worcestershire sauce. Tartar sauce. Barbecue sauce. Teriyaki sauce. Soy sauce, including reduced-sodium. Steak sauce. Canned and packaged gravies. Fish sauce. Oyster sauce. Cocktail sauce. Store-bought horseradish. Ketchup. Mustard. Meat flavorings and tenderizers. Bouillon cubes. Hot sauces. Pre-made or  packaged marinades. Pre-made or packaged taco seasonings. Relishes. Regular salad dressings. Other foods Salted popcorn and pretzels. The items listed above may not be a complete list of foods and beverages you should avoid. Contact a dietitian for more information. Where to find more information National Heart, Lung, and Blood Institute: PopSteam.is American Heart Association: www.heart.org Academy of Nutrition and Dietetics: www.eatright.org National Kidney Foundation:  www.kidney.org Summary The DASH eating plan is a healthy eating plan that has been shown to reduce high blood pressure (hypertension). It may also reduce your risk for type 2 diabetes, heart disease, and stroke. When on the DASH eating plan, aim to eat more fresh fruits and vegetables, whole grains, lean proteins, low-fat dairy, and heart-healthy fats. With the DASH eating plan, you should limit salt (sodium) intake to 2,300 mg a day. If you have hypertension, you may need to reduce your sodium intake to 1,500 mg a day. Work with your health care provider or dietitian to adjust your eating plan to your individual calorie needs. This information is not intended to replace advice given to you by your health care provider. Make sure you discuss any questions you have with your health care provider. Document Revised: 07/19/2019 Document Reviewed: 07/19/2019 Elsevier Patient Education  2023 [employer].

## 2022-03-08 ENCOUNTER — Encounter: Payer: Self-pay | Admitting: Family Medicine

## 2022-03-08 DIAGNOSIS — M1A0690 Idiopathic chronic gout, unspecified knee, without tophus (tophi): Secondary | ICD-10-CM

## 2022-03-08 DIAGNOSIS — G8929 Other chronic pain: Secondary | ICD-10-CM

## 2022-03-14 ENCOUNTER — Ambulatory Visit (INDEPENDENT_AMBULATORY_CARE_PROVIDER_SITE_OTHER): Payer: 59 | Admitting: Family Medicine

## 2022-03-14 ENCOUNTER — Encounter: Payer: Self-pay | Admitting: Family Medicine

## 2022-03-14 VITALS — BP 140/84 | HR 73 | Temp 98.6°F | Ht 70.0 in | Wt 280.2 lb

## 2022-03-14 DIAGNOSIS — M1A0690 Idiopathic chronic gout, unspecified knee, without tophus (tophi): Secondary | ICD-10-CM

## 2022-03-14 DIAGNOSIS — M1A0620 Idiopathic chronic gout, left knee, without tophus (tophi): Secondary | ICD-10-CM

## 2022-03-14 MED ORDER — TRIAMCINOLONE ACETONIDE 40 MG/ML IJ SUSP
40.0000 mg | Freq: Once | INTRAMUSCULAR | Status: AC
  Administered 2022-03-14: 40 mg via INTRA_ARTICULAR

## 2022-03-14 NOTE — Progress Notes (Signed)
Daven Montz T. Haadi Santellan, MD, CAQ Sports Medicine [facility] [address] [place], [address]  Phone: [phone]  FAX: [phone]  Melven Stockard - 41 y.o. male  MRN [id number]  Date of Birth: [birth year]  Date: 03/14/2022  PCP: Lynnda Child, MD  Referral: Lynnda Child, MD  Chief Complaint  Patient presents with   Knee Pain    Needs Fluid Drained off Left Knee   Subjective:   Neshawn Aird is a 41 y.o. very pleasant male patient with Body mass index is 40.21 kg/m. who presents with the following:  Patient presents with new consultation for knee acute pain.  Does have a history of chronic gout in the past.  Can barely bend his knee.  It is painful and quite swollen.  He does have a recent elevated uric acid, he has crystal proven right knee gout, obtained and January 29, 2022 aspirate.  He had a large volume aspirate done at [facility], and they sent the fluid off for birefringent microscopy.  Confirmed uric acid crystals.  He presents today and has been having intermittent knee pain off and on on the order of weeks.  He did do a round of some oral prednisone, and at its completion he also started some allopurinol.  Now he does have some persistent ongoing gout and swelling of the left knee.  He has pain with flexion of the knee and some pain with extension.  Review of Systems is noted in the HPI, as appropriate  Objective:   BP 140/84   Pulse 73   Temp 98.6 F (37 C) (Oral)   Ht 5\' 10"  (1.778 m)   Wt 280 lb 4 oz (127.1 kg)   SpO2 97%   BMI 40.21 kg/m   GEN: No acute distress; alert,appropriate. PULM: Breathing comfortably in no respiratory distress PSYCH: Normally interactive.   Left knee: He does lack 5 degrees of extension and is able to flex his knee to 70 degrees.  Stable to varus and valgus stress.  Large ballotable effusion.  Minimal medial and lateral joint line tenderness.  ACL PCL are  intact.  Laboratory and Imaging Data:  Assessment and Plan:     ICD-10-CM   1. Idiopathic chronic gout of left knee without tophus  M1A.0620     2. Chronic idiopathic gout of knee without tophus  M1A.0690 triamcinolone acetonide (KENALOG-40) injection 40 mg     Acute on chronic gout with left-sided knee gouty arthropathy exacerbation.  Previously crystal proven.  The aspirate appears to be consistent with gout exacerbation.  Failure of conservative measures including oral prednisone thus far, think it is reasonable to aspirate the patient's knee and injected with corticosteroid today.  I also do agree that longer term allopurinol might make some sense.  Aspiration/Injection Procedure Note Consuelo Thayne Aug 05, 1981 Date of procedure: 03/14/2022  Procedure: Large Joint Aspiration / Injection with synovial fluid aspiration of knee, L Indications: Pain  Procedure Details Patient verbally consented; risks, benefits, and alternatives explained. Patient prepped with Chloraprep. Ethyl chloride for anesthesia. 10 cc of 1% Lidocaine used in wheal then injected Subcutaneous fashion with 22 gauge needle on lateral approach. Under sterilne conditions, 18 gauge needle used via lateral approach to aspirate 100 cc of yellowish, somewhat cloudy fluid consistent with known gout. Then 9 cc of Lidocaine 1% and 1 mL of Kenalog 40 mg injected. Tolerated well, decreased pain, no complications. Medication: 1 mL of Kenalog 40 mg  Medication Management during today's office visit: Meds ordered this encounter  Medications   triamcinolone acetonide (KENALOG-40) injection 40 mg   There are no discontinued medications.  Orders placed today for conditions managed today: No orders of the defined types were placed in this encounter.   Follow-up if needed: No follow-ups on file.  Dr[employer] One speech-to-text software was used for transcription in this dictation.  Possible transcriptional errors can occur  using Animal nutritionist.   Signed,  Elpidio Galea. Vernel Donlan, MD   Outpatient Encounter Medications as of 03/14/2022  Medication Sig   allopurinol (ZYLOPRIM) 100 MG tablet Take 1 tablet (100 mg total) by mouth daily.   atorvastatin (LIPITOR) 40 MG tablet Take 1 tablet (40 mg total) by mouth daily.   gabapentin (NEURONTIN) 300 MG capsule TAKE 1 CAPSULE BY MOUTH TWICE A DAY   ibuprofen (ADVIL) 200 MG tablet Take 800 mg by mouth every 6 (six) hours as needed.   lisinopril (ZESTRIL) 10 MG tablet Take 1 tablet (10 mg total) by mouth daily.   [EXPIRED] triamcinolone acetonide (KENALOG-40) injection 40 mg    No facility-administered encounter medications on file as of 03/14/2022.

## 2022-03-16 ENCOUNTER — Ambulatory Visit: Payer: 59 | Admitting: Nurse Practitioner

## 2022-03-19 ENCOUNTER — Other Ambulatory Visit: Payer: Self-pay | Admitting: Family Medicine

## 2022-03-21 ENCOUNTER — Ambulatory Visit: Payer: 59 | Admitting: Family Medicine

## 2022-03-25 ENCOUNTER — Other Ambulatory Visit: Payer: Self-pay | Admitting: Family Medicine

## 2022-03-25 DIAGNOSIS — I10 Essential (primary) hypertension: Secondary | ICD-10-CM

## 2022-03-31 ENCOUNTER — Encounter: Payer: 59 | Attending: Family Medicine | Admitting: Dietician

## 2022-03-31 ENCOUNTER — Encounter: Payer: Self-pay | Admitting: Dietician

## 2022-03-31 VITALS — Ht 69.0 in | Wt 273.7 lb

## 2022-03-31 DIAGNOSIS — M1A0620 Idiopathic chronic gout, left knee, without tophus (tophi): Secondary | ICD-10-CM | POA: Diagnosis present

## 2022-03-31 DIAGNOSIS — E782 Mixed hyperlipidemia: Secondary | ICD-10-CM | POA: Diagnosis not present

## 2022-03-31 DIAGNOSIS — Z6841 Body Mass Index (BMI) 40.0 and over, adult: Secondary | ICD-10-CM | POA: Insufficient documentation

## 2022-03-31 DIAGNOSIS — I10 Essential (primary) hypertension: Secondary | ICD-10-CM | POA: Insufficient documentation

## 2022-03-31 DIAGNOSIS — Z713 Dietary counseling and surveillance: Secondary | ICD-10-CM | POA: Diagnosis not present

## 2022-03-31 DIAGNOSIS — R7303 Prediabetes: Secondary | ICD-10-CM | POA: Diagnosis not present

## 2022-03-31 NOTE — Patient Instructions (Signed)
Goal for sodium is 2000mg  a day or less; average 650mg  each meal (3x daily) Keep saturated fat to less than 20grams daily. Look for most foods to provide less than 20% of the daily value.  Aim for 45-60grams of carbs with each meal. Portions about the size of a fisted hand are adequate for one meal. Eat a lunch daily, plan to eat every 3-5 hours during the day for steady blood sugar.  Keep alcohol intake as low as possible. Keep meat portions small - moderate in size and avoid very high purine meats.

## 2022-03-31 NOTE — Progress Notes (Signed)
Medical Nutrition Therapy: Visit start time: 1550 end time: 1700  Assessment:   Referral Diagnosis: gout, hypertension, hyperlipidemia, pre-diabetes Other medical history/ diagnoses: sciatica, 2 herniated discs Psychosocial issues/ stress concerns: none  Medications, supplements: reconciled list in medical record   Preferred learning method:  Auditory Visual    Current weight: 273.7lbs Height: 5'9" BMI: 40.42 Patient's personal weight goal: 245lbs  Progress and evaluation:  Stopped red meats and seafood and most seasonings when diagnosed with gout (knees) 01/29/22. Recently added pizza occasionally and some other carbs after having low energy and almost passing out.  Patient reports weight loss of about 30lbs in past 2 months.  He has been reading labels for sodium saturated fat, and carbs and choosing foods with low percentages (of daily value) Recent lab results (02/25/22) indicate elevated total cholesterol of 254, Triglycerides 216, HDL 52, direct LDL 151, HbA1C 6.0%, uric acid elevated at 8.7mg /dl. Food allergies: none known Special diet practices: none Patient seeks help with knowing what he can eat and avoid future gout flareups while continuing to lose weight and improve health risk Next PCP appt is 03/2022   Dietary Intake:  Usual eating pattern includes 2-3 meals and 1-2 snacks per day. Dining out frequency: 2 meals per week. Who plans meals/ buys groceries? self Who prepares meals? self  Breakfast: fruit-- apple, grapes, strawberries, banana, mandarin Snack: none Lunch: sometimes skips; sub on multigrain with salami, cheese, veg (no ham) Snack: 2 fig newtons Supper: chicken, maybe with veg, often just chicken Snack: popcorn chips or popcorn (plain microwave); fruit Beverages: water, coffee in am 12oz; bourbon 1-2 drinks   Physical activity: limited due to knee pain   Intervention:   Nutrition Care Education:   Basic nutrition: basic food groups; appropriate  nutrient balance; appropriate meal and snack schedule; general nutrition guidelines    Weight control: determining reasonable weight loss rate; importance of low sugar and low fat choices; portion control; estimated energy needs for weight loss at 1600-1800 kcal, provided guidance for 50% CHO, 20% protein, and 30% fat for 1600kcal; discussed effects of very low calorie dieting; role of physical activity Advanced nutrition:  food label reading Pre-Diabetes: appropriate meal and snack schedule; appropriate carb intake and balance Hypertension: identifying high sodium foods, goal for sodium intake; identifying food sources of potassium, magnesium; options for seasoning foods Hyperlipidemia: healthy and unhealthy fats; role of fiber, vegetables and fruits; overview of Mediterranean diet  Gout:  restricting high purine foods, allowing all vegetables due to evidence of little impact in uric acid levels; avoiding highest purine meats/ animal foods; limiting alcohol intake with ideal of avoidance.  Other intervention notes: Patient has been following strict low purine and low calorie pattern due to fear of recurring gout flare-ups.  He is motivated to continue healthy food choices and habits with options to provide greater variety of foods and avoiding overly restrictive eating. He is less ready to reduce alcohol intake significantly; reports moderate alcohol intake most weeks. Established goals for nutrition changes with input from patient.   Nutritional Diagnosis:  [place]-2.2 Altered nutrition-related laboratory As related to hyperlipidemia, pre-diabetes, and gout; history of elevated BP.  As evidenced by patient with elevated total cholesterol, LDL, and Triglycerides, elevated HbA1C, and elevated uric acid. -3.3 Overweight/obesity As related to excess calories.  As evidenced by patient with current BMI of 40.42, following low calorie eating pattern for ongoing weight loss.   Education Materials given:   Designer, industrial/product with food lists, sample meal pattern Guidelines for Gout  handout Mediterranean Style Eating Visit summary with goals/ instructions   Learner/ who was taught:  Patient    Level of understanding: Verbalizes/ demonstrates competency  Demonstrated degree of understanding via:   Teach back Learning barriers: None  Willingness to learn/ readiness for change: Eager, change in progress   Monitoring and Evaluation:  Dietary intake, exercise, blood lipids, BG control, gout related symptoms, and body weight      follow up:  05/23/22 at 5:30pm

## 2022-04-11 ENCOUNTER — Encounter: Payer: Self-pay | Admitting: Family Medicine

## 2022-04-18 ENCOUNTER — Other Ambulatory Visit: Payer: Self-pay | Admitting: Family Medicine

## 2022-04-18 DIAGNOSIS — M1A0620 Idiopathic chronic gout, left knee, without tophus (tophi): Secondary | ICD-10-CM

## 2022-04-18 NOTE — Telephone Encounter (Signed)
Patient needs lab appointment to recheck uric acid while taking allopurinol  He also needs an office visit to recheck bp as HCTZ was stopped last month

## 2022-04-18 NOTE — Telephone Encounter (Signed)
Did you want to recheck his uric acid? Last time it was done was elevated?

## 2022-04-19 NOTE — Telephone Encounter (Signed)
Patient scheduled for Friday 8/25 for ov and labs

## 2022-04-22 ENCOUNTER — Ambulatory Visit (INDEPENDENT_AMBULATORY_CARE_PROVIDER_SITE_OTHER): Payer: 59 | Admitting: Family Medicine

## 2022-04-22 VITALS — BP 118/78 | HR 71 | Temp 97.5°F | Ht 70.0 in | Wt 272.1 lb

## 2022-04-22 DIAGNOSIS — I10 Essential (primary) hypertension: Secondary | ICD-10-CM | POA: Diagnosis not present

## 2022-04-22 DIAGNOSIS — M1A0620 Idiopathic chronic gout, left knee, without tophus (tophi): Secondary | ICD-10-CM

## 2022-04-22 LAB — URIC ACID: Uric Acid, Serum: 5.3 mg/dL (ref 4.0–7.8)

## 2022-04-22 MED ORDER — ALLOPURINOL 200 MG PO TABS: 200.0000 mg | ORAL_TABLET | Freq: Every day | ORAL | 0 refills | Status: DC

## 2022-04-22 MED ORDER — PREDNISONE 20 MG PO TABS: ORAL_TABLET | ORAL | 0 refills | Status: AC

## 2022-04-22 MED ORDER — COLCHICINE 0.6 MG PO TABS: ORAL_TABLET | ORAL | 0 refills | Status: DC

## 2022-04-22 NOTE — Assessment & Plan Note (Signed)
BP controlled. Cont lisinopril 10 mg. Discussed returning in 3 months to consider stopping medication as he continues healthy diet and weight loss.

## 2022-04-22 NOTE — Patient Instructions (Addendum)
Chronic Gout - Check uric acid today - increase allopurinol to 200 mg today - Return for recheck - lab only uric acid in 7-10 days   Acute gout - knee - start prednisone today - Colchicine to try out for for the next flare

## 2022-04-22 NOTE — Progress Notes (Signed)
Subjective:     Rodney Ford is a 41 y.o. male presenting for Follow-up (BP)     HPI  #HTN - on lisinopril 10 mg - feeling well - no cp or sob  #Gout - right knee - started swelling on Tuesday - treatment: 800 mg ibuprofen x 2, then aleve yesterday  - has never tried chlochine - did have some symptoms on the left knee - improved with water - trying to do diet changes  - current flare is better than yesterday -   Review of Systems  02/25/2022: Clinic - HTN - stop hctz, Gout - start allopurinol - return for uric acid and bp check  Social History   Tobacco Use  Smoking Status Some Days   Types: Cigars  Smokeless Tobacco Never  Tobacco Comments   3-4 days a week, 2-3 cigars        Objective:    BP Readings from Last 3 Encounters:  04/22/22 118/78  03/14/22 140/84  02/25/22 108/70   Wt Readings from Last 3 Encounters:  04/22/22 272 lb 2 oz (123.4 kg)  03/31/22 273 lb 11.2 oz (124.1 kg)  03/14/22 280 lb 4 oz (127.1 kg)    BP 118/78   Pulse 71   Temp (!) 97.5 F (36.4 C) (Temporal)   Ht 5\' 10"  (1.778 m)   Wt 272 lb 2 oz (123.4 kg)   SpO2 98%   BMI 39.05 kg/m    Physical Exam Constitutional:      Appearance: Normal appearance. He is not ill-appearing or diaphoretic.  HENT:     Right Ear: External ear normal.     Left Ear: External ear normal.     Nose: Nose normal.  Eyes:     General: No scleral icterus.    Extraocular Movements: Extraocular movements intact.     Conjunctiva/sclera: Conjunctivae normal.  Cardiovascular:     Rate and Rhythm: Normal rate and regular rhythm.     Heart sounds: No murmur heard. Pulmonary:     Effort: Pulmonary effort is normal. No respiratory distress.     Breath sounds: Normal breath sounds. No wheezing.  Musculoskeletal:     Cervical back: Neck supple.     Comments: Right knee - swelling, slight warmth  Skin:    General: Skin is warm and dry.  Neurological:     Mental Status: He is alert. Mental status  is at baseline.  Psychiatric:        Mood and Affect: Mood normal.           Assessment & Plan:   Problem List Items Addressed This Visit       Cardiovascular and Mediastinum   Essential hypertension    BP controlled. Cont lisinopril 10 mg. Discussed returning in 3 months to consider stopping medication as he continues healthy diet and weight loss.         Other   Chronic gout, crystal-proven - Primary    Current flare. Start prednisone as limited response to NSAIDs. Colchicine to try at the first sign of a future flare. If no response, may consider indomethacin trial. Increase allopurinol 100 >200 mg. Uric acid today and in 1 week to adjust dose as needed.       Relevant Medications   predniSONE (DELTASONE) 20 MG tablet   colchicine 0.6 MG tablet   allopurinol 200 MG TABS   Other Relevant Orders   Uric Acid     Return in about 3 months (around 07/23/2022)  for [facility] and BP/weight check with Tabitha.  Lynnda Child, MD

## 2022-04-22 NOTE — Assessment & Plan Note (Signed)
Current flare. Start prednisone as limited response to NSAIDs. Colchicine to try at the first sign of a future flare. If no response, may consider indomethacin trial. Increase allopurinol 100 >200 mg. Uric acid today and in 1 week to adjust dose as needed.

## 2022-04-29 ENCOUNTER — Other Ambulatory Visit: Payer: Self-pay | Admitting: Family Medicine

## 2022-04-29 DIAGNOSIS — M1A0620 Idiopathic chronic gout, left knee, without tophus (tophi): Secondary | ICD-10-CM

## 2022-05-03 ENCOUNTER — Other Ambulatory Visit: Payer: Self-pay | Admitting: Family Medicine

## 2022-05-04 ENCOUNTER — Other Ambulatory Visit (INDEPENDENT_AMBULATORY_CARE_PROVIDER_SITE_OTHER): Payer: 59

## 2022-05-04 DIAGNOSIS — M1A0620 Idiopathic chronic gout, left knee, without tophus (tophi): Secondary | ICD-10-CM

## 2022-05-04 LAB — URIC ACID: Uric Acid, Serum: 6.3 mg/dL (ref 4.0–7.8)

## 2022-05-05 ENCOUNTER — Other Ambulatory Visit: Payer: Self-pay | Admitting: Family Medicine

## 2022-05-05 DIAGNOSIS — M1A0620 Idiopathic chronic gout, left knee, without tophus (tophi): Secondary | ICD-10-CM

## 2022-05-05 MED ORDER — ALLOPURINOL 200 MG PO TABS: 200.0000 mg | ORAL_TABLET | Freq: Every day | ORAL | 1 refills | Status: DC

## 2022-05-11 ENCOUNTER — Telehealth: Payer: Self-pay | Admitting: Family Medicine

## 2022-05-11 NOTE — Telephone Encounter (Signed)
Called [facility] and was told that they filled 100 mg tablets of allopurinol due to pt's insurance not covering the 200mg  tablet. Pt notified that RX is ready for pick up.

## 2022-05-11 NOTE — Telephone Encounter (Signed)
Patient called in stating that his prescription for Allopurinol 200 MG TABS couldn't be filled. They sent a text to the patient stating that they will be reaching out for an alternate. He hasn't heard anything back and wants to know what to do and what's going on. Please do. Thank you!

## 2022-06-03 ENCOUNTER — Other Ambulatory Visit: Payer: Self-pay | Admitting: Family Medicine

## 2022-06-03 DIAGNOSIS — M1A0620 Idiopathic chronic gout, left knee, without tophus (tophi): Secondary | ICD-10-CM

## 2022-07-06 ENCOUNTER — Encounter: Payer: Self-pay | Admitting: Family

## 2022-07-06 DIAGNOSIS — M1A0620 Idiopathic chronic gout, left knee, without tophus (tophi): Secondary | ICD-10-CM

## 2022-07-07 MED ORDER — COLCHICINE 0.6 MG PO TABS: ORAL_TABLET | ORAL | 0 refills | Status: AC

## 2022-07-14 ENCOUNTER — Other Ambulatory Visit: Payer: Self-pay | Admitting: Family

## 2022-07-14 DIAGNOSIS — M1A0620 Idiopathic chronic gout, left knee, without tophus (tophi): Secondary | ICD-10-CM

## 2022-07-25 ENCOUNTER — Encounter: Payer: 59 | Admitting: Family

## 2022-08-03 ENCOUNTER — Other Ambulatory Visit: Payer: Self-pay | Admitting: Family

## 2022-08-03 DIAGNOSIS — M1A0620 Idiopathic chronic gout, left knee, without tophus (tophi): Secondary | ICD-10-CM

## 2022-08-05 ENCOUNTER — Other Ambulatory Visit: Payer: Self-pay

## 2022-08-05 DIAGNOSIS — M1A0620 Idiopathic chronic gout, left knee, without tophus (tophi): Secondary | ICD-10-CM

## 2022-08-05 MED ORDER — ALLOPURINOL 100 MG PO TABS: 200.0000 mg | ORAL_TABLET | Freq: Every day | ORAL | 0 refills | Status: AC

## 2022-08-21 ENCOUNTER — Telehealth: Payer: 59 | Admitting: Family Medicine

## 2022-08-21 DIAGNOSIS — J208 Acute bronchitis due to other specified organisms: Secondary | ICD-10-CM

## 2022-08-21 MED ORDER — BENZONATATE 200 MG PO CAPS: 200.0000 mg | ORAL_CAPSULE | Freq: Two times a day (BID) | ORAL | 0 refills | Status: AC | PRN

## 2022-08-21 MED ORDER — PREDNISONE 20 MG PO TABS: 20.0000 mg | ORAL_TABLET | Freq: Two times a day (BID) | ORAL | 0 refills | Status: AC

## 2022-08-21 NOTE — Progress Notes (Signed)
We are sorry that you are not feeling well.  Here is how we plan to help!  Based on your presentation I believe you most likely have A cough due to a virus.  This is called viral bronchitis and is best treated by rest, plenty of fluids and control of the cough.  You may use Ibuprofen or Tylenol as directed to help your symptoms.     In addition you may use A prescription cough medication called Tessalon Perles 100mg . You may take 1-2 capsules every 8 hours as needed for your cough.  Prednisone 20 mg po bid for 5 days.    From your responses in the eVisit questionnaire you describe inflammation in the upper respiratory tract which is causing a significant cough.  This is commonly called Bronchitis and has four common causes:   Allergies Viral Infections Acid Reflux Bacterial Infection Allergies, viruses and acid reflux are treated by controlling symptoms or eliminating the cause. An example might be a cough caused by taking certain blood pressure medications. You stop the cough by changing the medication. Another example might be a cough caused by acid reflux. Controlling the reflux helps control the cough.  USE OF BRONCHODILATOR ("RESCUE") INHALERS: There is a risk from using your bronchodilator too frequently.  The risk is that over-reliance on a medication which only relaxes the muscles surrounding the breathing tubes can reduce the effectiveness of medications prescribed to reduce swelling and congestion of the tubes themselves.  Although you feel brief relief from the bronchodilator inhaler, your asthma may actually be worsening with the tubes becoming more swollen and filled with mucus.  This can delay other crucial treatments, such as oral steroid medications. If you need to use a bronchodilator inhaler daily, several times per day, you should discuss this with your provider.  There are probably better treatments that could be used to keep your asthma under control.     HOME CARE Only take  medications as instructed by your medical team. Complete the entire course of an antibiotic. Drink plenty of fluids and get plenty of rest. Avoid close contacts especially the very young and the elderly Cover your mouth if you cough or cough into your sleeve. Always remember to wash your hands A steam or ultrasonic humidifier can help congestion.   GET HELP RIGHT AWAY IF: You develop worsening fever. You become short of breath You cough up blood. Your symptoms persist after you have completed your treatment plan MAKE SURE YOU  Understand these instructions. Will watch your condition. Will get help right away if you are not doing well or get worse.    Thank you for choosing an e-visit.  Your e-visit answers were reviewed by a board certified advanced clinical practitioner to complete your personal care plan. Depending upon the condition, your plan could have included both over the counter or prescription medications.  Please review your pharmacy choice. Make sure the pharmacy is open so you can pick up prescription now. If there is a problem, you may contact your provider through and have the prescription routed to another pharmacy.  Your safety is important to [employer]. If you have drug allergies check your prescription carefully.   For the next 24 hours you can use MyChart to ask questions about today's visit, request a non-urgent call back, or ask for a work or school excuse. You will get an email in the next two days asking about your experience. I hope that your e-visit has been valuable  and will speed your recovery.

## 2022-10-07 ENCOUNTER — Encounter: Payer: 59 | Admitting: Family

## 2023-04-19 ENCOUNTER — Ambulatory Visit: Payer: Self-pay | Admitting: Dietician

## 2023-05-18 ENCOUNTER — Encounter: Payer: Self-pay | Admitting: Dietician

## 2023-05-18 ENCOUNTER — Encounter: Payer: No Typology Code available for payment source | Attending: Family Medicine | Admitting: Dietician

## 2023-05-18 VITALS — Ht 70.0 in | Wt 290.6 lb

## 2023-05-18 DIAGNOSIS — G4700 Insomnia, unspecified: Secondary | ICD-10-CM | POA: Diagnosis not present

## 2023-05-18 DIAGNOSIS — E785 Hyperlipidemia, unspecified: Secondary | ICD-10-CM | POA: Insufficient documentation

## 2023-05-18 DIAGNOSIS — Z713 Dietary counseling and surveillance: Secondary | ICD-10-CM | POA: Insufficient documentation

## 2023-05-18 DIAGNOSIS — M1A0620 Idiopathic chronic gout, left knee, without tophus (tophi): Secondary | ICD-10-CM

## 2023-05-18 DIAGNOSIS — Z6841 Body Mass Index (BMI) 40.0 and over, adult: Secondary | ICD-10-CM | POA: Insufficient documentation

## 2023-05-18 DIAGNOSIS — I10 Essential (primary) hypertension: Secondary | ICD-10-CM | POA: Diagnosis present

## 2023-05-18 DIAGNOSIS — M48061 Spinal stenosis, lumbar region without neurogenic claudication: Secondary | ICD-10-CM | POA: Diagnosis not present

## 2023-05-18 DIAGNOSIS — M1A00X0 Idiopathic chronic gout, unspecified site, without tophus (tophi): Secondary | ICD-10-CM | POA: Diagnosis present

## 2023-05-18 DIAGNOSIS — R7303 Prediabetes: Secondary | ICD-10-CM

## 2023-05-18 DIAGNOSIS — E782 Mixed hyperlipidemia: Secondary | ICD-10-CM

## 2023-05-18 NOTE — Patient Instructions (Signed)
Use oldwayspt.org for more recipes and meal options for Mediterranean eating pattern.  Eat 3 meals every day including protein + moderate portion of starch + generous portion of low carb veggies.  Try a bento box lunch with low fat cheese or nuts for protein + cut raw veggies + healthy grain like triscuit crackers or whole grain bread.  Control portions of chips by taking some out of multi-serving package and put the package away. Add a protein like part-skim or 2% cheese, hummus to feel full enough; other options -- small portion of nuts, or low sugar trail mix (or make your own)

## 2023-05-18 NOTE — Progress Notes (Signed)
Medical Nutrition Therapy: Visit start time: 1000  end time: 1100  Assessment:   Referral Diagnosis: HTN, HLD, gout, insomnia, spinal stenosis, obesity Other medical history/ diagnoses: sleep apnea Psychosocial issues/ stress concerns: none  Medications, supplements: reconciled list in medical record   Current weight: 290.6lbs Height: 5'10" BMI: 41.7  Progress and evaluation:  Patient was seen for MNT visit 03/2022, did not follow up.  Patient reports making dietary improvements including decreasing breads, total avoidance of red meats and shellfish, also fried foods. Knee pain has improved but still unable to significantly increase activity. He reports impact increases pain.   Food allergies: none known Special diet practices: none; low purine diet for gout Patient seeks help with weight loss and overall balanced nutrition    Dietary Intake:  Usual eating pattern includes 2-3 meals and 2-3 snacks per day. Dining out frequency: 5-6 meals per week. Who plans meals/ buys groceries? self Who prepares meals? self  Breakfast: coffee; biscuit with egg and cheese sometimes chicken; sometimes skips  green smoothie, celery, spinach, green pepper, oat milk, 1/2 banana for veg intake Snack: hint of lime tortilla chips; occ jalapeno kettle chips Lunch: 9/18 chicken pita from [employer]; fish taco; usually out; sometimes eats 1-2 snacks instead of meal Snack: none or same as am Supper: chicken or fish, no fried, no shellfish -- wings uses Snack: none tries to avoid eating after 8pm Beverages: water, coffee, sometimes bourbon  Physical activity: no regular exercise    Intervention:   Nutrition Care Education:   Basic nutrition: appropriate nutrient balance; appropriate meal and snack schedule; general nutrition guidelines; instructed on Mediterranean eating pattern for heart health, weight control, and anti-inflammatory benefits    Weight control: importance of low sugar and low fat choices;  portion control strategies including pre-portioning snacks and putting large container away; role of physical activity; increasing satiety with low carb vegetables, eating at regular intervals Hypertension:  identifying high sodium foods, identifying food sources of potassium, magnesium; options for seasoning foods Hyperlipidemia:  healthy and unhealthy fats Other: reviewed high purine foods to limit to reduce risk for gout flare-up   Other intervention notes: Patient has been working on positive diet changes and is motivated to continue.  Established goals for additional change with direction from patient.     Nutritional Diagnosis:  [place]-2.2 Altered nutrition-related laboratory As related to hyperlipidemia.  As evidenced by elevated total cholesterol and LDL, elevated triglycerides. [place]-3.3 Overweight/obesity As related to excess calories, inadequate physical activity due to pain, insomnia.  As evidenced by patient with current BMI of 41.7.   Education Materials given:  Get Healthy with Mediterranean Style Eating Build a Pyramid Skillet Meal Visit summary with goals/ instructions to be viewed via patient portal   Learner/ who was taught:  Patient   Level of understanding: Verbalizes/ demonstrates competency  Demonstrated degree of understanding via:   Teach back Learning barriers: None  Willingness to learn/ readiness for change: Eager, change in progress  Monitoring and Evaluation:  Dietary intake, exercise, and body weight      follow up:  07/03/23

## 2023-07-03 ENCOUNTER — Ambulatory Visit: Payer: Self-pay | Admitting: Dietician
